# Patient Record
Sex: Male | Born: 1965 | Race: Black or African American | Hispanic: No | Marital: Single | Smoking: Never smoker
Health system: Southern US, Community
[De-identification: ages and names within clinical notes are randomized; demographics above are authoritative.]

## PROBLEM LIST (undated history)

## (undated) DIAGNOSIS — I10 Essential (primary) hypertension: Secondary | ICD-10-CM

## (undated) DIAGNOSIS — L309 Dermatitis, unspecified: Secondary | ICD-10-CM

## (undated) DIAGNOSIS — R51 Headache: Secondary | ICD-10-CM

## (undated) HISTORY — DX: Dermatitis, unspecified: L30.9

## (undated) HISTORY — DX: Headache: R51

## (undated) HISTORY — DX: Essential (primary) hypertension: I10

---

## 1999-01-13 ENCOUNTER — Emergency Department (HOSPITAL_COMMUNITY): Admission: EM | Admit: 1999-01-13 | Discharge: 1999-01-13 | Payer: Self-pay | Admitting: Emergency Medicine

## 2000-09-14 ENCOUNTER — Encounter: Payer: Self-pay | Admitting: Emergency Medicine

## 2000-09-14 ENCOUNTER — Emergency Department (HOSPITAL_COMMUNITY): Admission: EM | Admit: 2000-09-14 | Discharge: 2000-09-14 | Payer: Self-pay | Admitting: *Deleted

## 2005-04-01 ENCOUNTER — Ambulatory Visit: Payer: Self-pay | Admitting: Internal Medicine

## 2005-04-08 ENCOUNTER — Ambulatory Visit: Payer: Self-pay | Admitting: Internal Medicine

## 2005-06-30 ENCOUNTER — Ambulatory Visit: Payer: Self-pay | Admitting: Internal Medicine

## 2007-04-09 DIAGNOSIS — J309 Allergic rhinitis, unspecified: Secondary | ICD-10-CM | POA: Insufficient documentation

## 2007-12-07 ENCOUNTER — Ambulatory Visit: Payer: Self-pay | Admitting: Internal Medicine

## 2009-03-23 ENCOUNTER — Ambulatory Visit: Payer: Self-pay | Admitting: Internal Medicine

## 2009-03-23 DIAGNOSIS — R519 Headache, unspecified: Secondary | ICD-10-CM | POA: Insufficient documentation

## 2009-03-23 DIAGNOSIS — R51 Headache: Secondary | ICD-10-CM

## 2009-04-06 ENCOUNTER — Ambulatory Visit: Payer: Self-pay | Admitting: Internal Medicine

## 2009-04-06 DIAGNOSIS — I10 Essential (primary) hypertension: Secondary | ICD-10-CM | POA: Insufficient documentation

## 2009-04-24 ENCOUNTER — Ambulatory Visit: Payer: Self-pay | Admitting: Internal Medicine

## 2009-04-27 ENCOUNTER — Telehealth: Payer: Self-pay | Admitting: Internal Medicine

## 2009-06-05 ENCOUNTER — Ambulatory Visit: Payer: Self-pay | Admitting: Internal Medicine

## 2010-10-31 ENCOUNTER — Ambulatory Visit (INDEPENDENT_AMBULATORY_CARE_PROVIDER_SITE_OTHER): Payer: BC Managed Care – PPO | Admitting: Internal Medicine

## 2010-10-31 ENCOUNTER — Encounter: Payer: Self-pay | Admitting: Internal Medicine

## 2010-10-31 VITALS — BP 158/96 | HR 102 | Temp 97.9°F | Ht 72.0 in | Wt 174.0 lb

## 2010-10-31 DIAGNOSIS — I10 Essential (primary) hypertension: Secondary | ICD-10-CM

## 2010-10-31 MED ORDER — HYDROCHLOROTHIAZIDE 25 MG PO TABS: 25.0000 mg | ORAL_TABLET | Freq: Every day | ORAL | Status: DC

## 2010-10-31 NOTE — Patient Instructions (Signed)
Hypertension (High Blood Pressure) As your heart beats, it forces blood through your arteries. This force is your blood pressure. If the pressure is too high, it is called hypertension (HTN) or high blood pressure. HTN is dangerous because you may have it and not know it. High blood pressure may mean that your heart has to work harder to pump blood. Your arteries may be narrow or stiff. The extra work puts you at risk for heart disease, stroke, and other problems.   Blood pressure consists of two numbers, a higher number over a lower, 110/72, for example. It is stated as "110 over 72." The ideal is below 120 for the top number (systolic) and under 80 for the bottom (diastolic). Write down your blood pressure today. You should pay close attention to your blood pressure if you have certain conditions such as:  Heart failure.   Prior heart attack.     Diabetes    Chronic kidney disease.     Prior stroke.     Multiple risk factors for heart disease.     To see if you have HTN, your blood pressure should be measured while you are seated with your arm held at the level of the heart. It should be measured at least twice. A one-time elevated blood pressure reading (especially in the Emergency Department) does not mean that you need treatment. There may be conditions in which the blood pressure is different between your right and left arms. It is important to see your caregiver soon for a recheck. Most people have essential hypertension which means that there is not a specific cause. This type of high blood pressure may be lowered by changing lifestyle factors such as:  Stress.   Smoking.    Lack of exercise.     Excessive weight.   Drug/tobacco/alcohol use.     Eating less salt.     Most people do not have symptoms from high blood pressure until it has caused damage to the body. Effective treatment can often prevent, delay or reduce that damage. TREATMENT Treatment for high blood pressure, when  a cause has been identified, is directed at the cause. There are a large number of medications to treat HTN. These fall into several categories, and your caregiver will help you select the medicines that are best for you. Medications may have side effects. You should review side effects with your caregiver. If your blood pressure stays high after you have made lifestyle changes or started on medicines,    Your medication(s) may need to be changed.   Other problems may need to be addressed.   Be certain you understand your prescriptions, and know how and when to take your medicine.   Be sure to follow up with your caregiver within the time frame advised (usually within two weeks) to have your blood pressure rechecked and to review your medications.   If you are taking more than one medicine to lower your blood pressure, make sure you know how and at what times they should be taken. Taking two medicines at the same time can result in blood pressure that is too low.  SEEK IMMEDIATE MEDICAL CARE IF YOU DEVELOP:  A severe headache, blurred or changing vision, or confusion.   Unusual weakness or numbness, or a faint feeling.   Severe chest or abdominal pain, vomiting, or breathing problems.  MAKE SURE YOU:    Understand these instructions.   Will watch your condition.   Will get help right  away if you are not doing well or get worse.  Document Released: 07/21/2005 Document Re-Released: 01/08/2010 [facility] Patient Information 2011 [place], [place].  Your headaches may be related to high blood pressure. Your exam today is normal: no signs of any neurologic problem, no sign of brain tumor, etc.   Plan - take the hydrochlorothiazide every day           Ok to take BC's for the headache - watch for stomach pains           Come by in 1 week for BP check with my nurse           Call for problems.

## 2010-11-03 ENCOUNTER — Encounter: Payer: Self-pay | Admitting: Internal Medicine

## 2010-11-03 NOTE — Progress Notes (Signed)
  Subjective:    Patient ID: Donald Matthews, male    DOB: 28-Apr-1966, 45 y.o.   MRN: [id number]  HPI  Mr. Therien presents for follow-up of HTN. He had been on HCTZ as single agent but stopped the medication and worked on life style management with better diet and increased exercise. He reports that his BP at home has been well controlled with systolics in the 120-130 range.. He has had no complaints: no headache, no palpations, no erectile problems.  PMH, FamHx and SocHx reviewed for relevance and chage.     Review of Systems Review of Systems  Constitutional:  Negative for fever, chills, activity change and unexpected weight change.  HENT:  Negative for hearing loss, ear pain, congestion, neck stiffness and postnasal drip.   Eyes: Negative for pain, discharge and visual disturbance.  Respiratory: Negative for chest tightness and wheezing.   Cardiovascular: Negative for chest pain and palpitations.       [No decreased exercise tolerance Gastrointestinal: [No change in bowel habit. No bloating or gas. No reflux or indigestion Genitourinary: Negative for urgency, frequency, flank pain and difficulty urinating.  Musculoskeletal: Negative for myalgias, back pain, arthralgias and gait problem.  Neurological: Negative for dizziness, tremors, weakness and headaches.  Hematological: Negative for adenopathy.  Psychiatric/Behavioral: Negative for behavioral problems and dysphoric mood.       Objective:   Physical Exam  Nursing note and vitals reviewed. Constitutional: He is oriented to person, place, and time. He appears well-developed and well-nourished. No distress.  HENT:  Head: Normocephalic and atraumatic.  Eyes: Conjunctivae and EOM are normal. Pupils are equal, round, and reactive to light.  Neck: Neck supple.  Cardiovascular: Normal rate and regular rhythm.   Pulmonary/Chest: Effort normal and breath sounds normal.  Musculoskeletal: Normal range of motion.  Neurological: He is  alert and oriented to person, place, and time. He has normal reflexes.  Skin: Skin is warm and dry.  Psychiatric: He has a normal mood and affect. His behavior is normal.   Rechecked BP using a large adult cuff: 140/86        Assessment & Plan:  1. Hypertension - well controlled at home by patient's report on life-style management alone.  Plan - continue present life-style management            Bring home monitor by for calibration at his convenience.

## 2010-11-04 ENCOUNTER — Ambulatory Visit (INDEPENDENT_AMBULATORY_CARE_PROVIDER_SITE_OTHER): Payer: BC Managed Care – PPO | Admitting: *Deleted

## 2010-11-04 VITALS — BP 132/82

## 2010-11-04 DIAGNOSIS — I10 Essential (primary) hypertension: Secondary | ICD-10-CM

## 2010-12-13 ENCOUNTER — Encounter: Payer: Self-pay | Admitting: Internal Medicine

## 2010-12-16 ENCOUNTER — Encounter: Payer: Self-pay | Admitting: Internal Medicine

## 2010-12-16 ENCOUNTER — Ambulatory Visit (INDEPENDENT_AMBULATORY_CARE_PROVIDER_SITE_OTHER): Payer: BC Managed Care – PPO | Admitting: Internal Medicine

## 2010-12-16 DIAGNOSIS — N529 Male erectile dysfunction, unspecified: Secondary | ICD-10-CM | POA: Insufficient documentation

## 2010-12-16 DIAGNOSIS — I10 Essential (primary) hypertension: Secondary | ICD-10-CM

## 2010-12-16 MED ORDER — SILDENAFIL CITRATE 100 MG PO TABS: 100.0000 mg | ORAL_TABLET | ORAL | Status: DC | PRN

## 2010-12-16 NOTE — Progress Notes (Signed)
  Subjective:    Patient ID: Donald Matthews, male    DOB: Apr 04, 1966, 45 y.o.   MRN: [id number]  HPI Donald Matthews presents c/o 1 year history of ED. He is able to attain and sustain and erection to the completion of intercourse but not always and not as satisfactorily.   He reports his BP has been well controlled.   PMH, FamHx and SocHx reviewed for any changes and relevance.    Review of Systems Review of Systems  Constitutional:  Negative for fever, chills, activity change and unexpected weight change.  HENT:  Negative for hearing loss, ear pain, congestion, neck stiffness and postnasal drip.   Eyes: Negative for pain, discharge and visual disturbance.  Respiratory: Negative for chest tightness and wheezing.   Cardiovascular: Negative for chest pain and palpitations.       [No decreased exercise tolerance Gastrointestinal: [No change in bowel habit. No bloating or gas. No reflux or indigestion Genitourinary: Negative for urgency, frequency, flank pain and difficulty urinating.  Musculoskeletal: Negative for myalgias, back pain, arthralgias and gait problem.  Neurological: Negative for dizziness, tremors, weakness and headaches.  Hematological: Negative for adenopathy.  Psychiatric/Behavioral: Negative for behavioral problems and dysphoric mood.       Objective:   Physical Exam WNWD AA male in no distress HEENT C&S clear Cor - RRR       Assessment & Plan:  1. ED - patient with moderate ED symptoms. He has no h/o heart disease and doesn't take any nitroglycerine products. Discussed the mechanisim of action and duration.  Plan - viagra 50 to 100mg  prn. (samples provided)  2. Blood pressure - well controlled.

## 2011-07-14 ENCOUNTER — Encounter: Payer: Self-pay | Admitting: Internal Medicine

## 2011-07-14 ENCOUNTER — Ambulatory Visit (INDEPENDENT_AMBULATORY_CARE_PROVIDER_SITE_OTHER): Payer: BC Managed Care – PPO | Admitting: Internal Medicine

## 2011-07-14 VITALS — BP 148/80 | HR 95 | Temp 97.8°F | Wt 172.0 lb

## 2011-07-14 DIAGNOSIS — L309 Dermatitis, unspecified: Secondary | ICD-10-CM

## 2011-07-14 DIAGNOSIS — I10 Essential (primary) hypertension: Secondary | ICD-10-CM

## 2011-07-14 DIAGNOSIS — L259 Unspecified contact dermatitis, unspecified cause: Secondary | ICD-10-CM

## 2011-07-14 MED ORDER — PREDNISONE 20 MG PO TABS: 20.0000 mg | ORAL_TABLET | Freq: Every day | ORAL | Status: AC

## 2011-07-14 MED ORDER — HYDROCHLOROTHIAZIDE 25 MG PO TABS: 25.0000 mg | ORAL_TABLET | Freq: Every day | ORAL | Status: DC

## 2011-07-14 MED ORDER — SILDENAFIL CITRATE 100 MG PO TABS: 100.0000 mg | ORAL_TABLET | ORAL | Status: DC | PRN

## 2011-07-14 NOTE — Assessment & Plan Note (Signed)
Patient with flare of eczema  Plan - prednisone 20mg  daily for 10 days with 1 refill as needed.

## 2011-07-14 NOTE — Progress Notes (Signed)
  Subjective:    Patient ID: Donald Matthews, male    DOB: March 20, 1966, 45 y.o.   MRN: [id number]  HPI Mr. Brissett has a long h/o eczema for which he uses protopic, Ammonium lactate 12% cream; triamcinolone 0/.1% cream and protopic all prescribed by Dr. Gwen Pounds, Dermatologist in [place]. He has recently had a bad flare with skin flaking, pruritis with subsequent excoriation with bleeding. He has had no fever, chills. NO frank pustules  Past Medical History  Diagnosis Date  . HTN (hypertension)   . Headache   . Allergic rhinitis    No past surgical history on file. Family History  Problem Relation Age of Onset  . Hypertension Father   . Hyperlipidemia Father   . Hypertension Mother   . Heart attack Neg Hx   . Coronary artery disease Neg Hx   . Colon cancer Neg Hx   . Prostate cancer Neg Hx   . Diabetes Neg Hx   . Cancer Neg Hx     prostate, colon    History   Social History  . Marital Status: Single    Spouse Name: N/A    Number of Children: 0  . Years of Education: N/A   Occupational History  . correction officer    Social History Main Topics  . Smoking status: Never Smoker   . Smokeless tobacco: Not on file  . Alcohol Use: Yes  . Drug Use: Not on file  . Sexually Active: Not on file   Other Topics Concern  . Not on file   Social History Narrative   HSG, 3 years collegeMarried - '01 - 5 years/divorcedNo children       Review of Systems System review is negative for any constitutional, cardiac, pulmonary, GI or neuro symptoms or complaints other than as described in the HPI.     Objective:   Physical Exam vitals - stable Gen'l - WNWD AA man no distress Pulm - normal respirations Cor- RRR Derm - chronic skin changes on the back with signs of excoriation.        Assessment & Plan:

## 2012-02-24 ENCOUNTER — Ambulatory Visit (INDEPENDENT_AMBULATORY_CARE_PROVIDER_SITE_OTHER): Payer: BC Managed Care – PPO | Admitting: Internal Medicine

## 2012-02-24 ENCOUNTER — Encounter: Payer: Self-pay | Admitting: Internal Medicine

## 2012-02-24 VITALS — BP 134/92 | HR 97 | Temp 98.0°F | Resp 16 | Wt 172.0 lb

## 2012-02-24 DIAGNOSIS — L639 Alopecia areata, unspecified: Secondary | ICD-10-CM | POA: Insufficient documentation

## 2012-02-24 MED ORDER — PREDNISONE 20 MG PO TABS: 20.0000 mg | ORAL_TABLET | Freq: Every day | ORAL | Status: AC

## 2012-02-24 NOTE — Progress Notes (Signed)
  Subjective:    Patient ID: Donald Matthews, male    DOB: 12-18-1965, 46 y.o.   MRN: [id number]  HPI Donald Matthews presents for hair loss over the last month. No new hair products, shampoos, etc. His hair has loss was all of a sudden. He has minimal pruritus. He does have chronic eczema with a truncal, extremity distribution.  Past Medical History  Diagnosis Date  . HTN (hypertension)   . Headache   . Allergic rhinitis   . Chronic eczema    No past surgical history on file. Family History  Problem Relation Age of Onset  . Hypertension Father   . Hyperlipidemia Father   . Hypertension Mother   . Heart attack Neg Hx   . Coronary artery disease Neg Hx   . Colon cancer Neg Hx   . Prostate cancer Neg Hx   . Diabetes Neg Hx   . Cancer Neg Hx     prostate, colon    History   Social History  . Marital Status: Single    Spouse Name: N/A    Number of Children: 0  . Years of Education: N/A   Occupational History  . correction officer    Social History Main Topics  . Smoking status: Never Smoker   . Smokeless tobacco: Not on file  . Alcohol Use: Yes  . Drug Use: Not on file  . Sexually Active: Not on file   Other Topics Concern  . Not on file   Social History Narrative   HSG, 3 years collegeMarried - '01 - 5 years/divorcedNo children    Current Outpatient Prescriptions on File Prior to Visit  Medication Sig Dispense Refill  . ammonium lactate (AMLACTIN) 12 % cream Apply topically as needed.        [name] aspirin-acetaminophen-caffeine (EXCEDRIN MIGRAINE) 250-250-65 MG per tablet Take 1 tablet by mouth as needed.        . cephALEXin (KEFLEX) 500 MG capsule Take 500 mg by mouth as needed.        . hydrochlorothiazide (HYDRODIURIL) 25 MG tablet Take 1 tablet (25 mg total) by mouth daily.  30 tablet  121  . hydrocodone-acetaminophen (LORCET-HD) 5-500 MG per capsule Take 1 capsule by mouth every 6 (six) hours as needed.        . sildenafil (VIAGRA) 100 MG tablet Take 1 tablet  (100 mg total) by mouth as needed for erectile dysfunction.  6 tablet  11  . tacrolimus (PROTOPIC) 0.1 % ointment Apply topically 2 (two) times daily as needed.        . triamcinolone cream (KENALOG) 0.1 % Apply topically 2 (two) times daily.            Review of Systems .mnroxb     Objective:   Physical Exam Filed Vitals:   02/24/12 1138  BP: 134/92  Pulse: 97  Temp: 98 F (36.7 C)  Resp: 16   Gen'l- WNWD athletic appearing AA man Derm - thinning hair but no complete bald spots. Mild grey scaling on scalp        Assessment & Plan:

## 2012-02-24 NOTE — Assessment & Plan Note (Signed)
New outbreak of hair loss without total baldness. 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  Plan  Patient education  Mild dandruff shampoo  See Dr. Gwen Pounds - for derm consult.

## 2012-02-24 NOTE — Patient Instructions (Addendum)
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 There are no areas of total hair loss. For now recommend using a mild dandruff shampoo of choice. Would not use topical steroids at this point. Go ahead and make an appointment with your dermatologist to be sure of the diagnosis and treatment plan. This very often will resolve spontaneously without treatment.   Alopecia Areata Alopecia areata is a self-destructing (autoimmune) disease that results in the loss of hair. In this condition your body's immune system attacks the hair follicle. The hair follicle is responsible for growing hair. Hair loss can occur on the scalp and other parts of the body. It usually starts as one or more small, round, smooth patches of hair loss. It occurs in males and females of all ages and races, but usually starts before age 59. The scalp is the most commonly affected area, but the beard or any hair-bearing site can be affected. This type of hair loss does not leave scars where the hair was lost.   Many people with alopecia areata only have a few patches of hair loss. In others, extensive patchy hair loss occurs. In a few people, all scalp hair is lost (alopecia totalis), or hair is lost from the entire scalp and body (alopecia universalis). No matter how widespread the hair loss, the hair follicles remain alive and are ready to resume normal hair production whenever they receive the correct signal. Hair re-growth may occur without treatment and can even restart after years of hair loss.   CAUSES   It is thought that something triggers the immune system to stop hair growth. It is not always known what the cause is. Some people have genetic markers that can increase the chance of developing alopecia areata. Alopecia areata often occurs in families whose members have had:  Asthma.   Hay fever.   Atopic eczema.   Some autoimmune diseases  may also be a trigger, such as:   Thyroid disease.   Diabetes.   Rheumatoid arthritis.   Lupus erythematosus.   Vitiligo.   Pernicious anemia.   Addison's disease.  OTHER SYMPTOMS In some people, the nail beds may develop rows of tiny dents (stippling) or the nail beds can become distorted. Other than the hair and nail beds, no other body part is affected.   PROGNOSIS   Alopecia areata is not medically disabling. People with alopecia areata are usually in excellent health. Hair loss can be emotionally difficult. The National Alopecia Areata Foundation has resources available to help individuals and families with alopecia areata. Their goal is to help people with the condition live full, productive lives. There are many successful, well-adjusted, contented people living with Alopecia areata. Alopecia areata can be overcome with:  A positive self image.   Sound medical facts.   The support of others, such as:   Sometimes professional counseling is helpful to develop one's self-confidence and positive self-image.  TREATMENT   There is no cure for alopecia areata. There are several available treatments. Treatments are most effective in milder cases. No treatment is effective for everyone. Choice of treatment depends mainly on a person's age and the extent of their hair loss. Alopecia areata occurs in two forms:    A mild patchy form where less than 50 percent of scalp hair is lost.   An extensive form where greater than 50 percent of scalp hair is lost.  These two  forms of alopecia areata behave quite differently, and the choice of treatment depends on which form is present. Current treatments do not turn alopecia areata off. They can stimulate the hair follicle to produce hair.   Some medications used to treat mild cases include:  Cortisone injections. The most common treatment is the injection of cortisone into the bare skin patches. The injections are usually given by a caregiver  specializing in skin issues (dermatologist). This caregiver will use a tiny needle to give multiple injections into the skin in and around the bare patches. The injections are repeated once a month. If new hair growth occurs, it is usually visible within 4 weeks. Treatment does not prevent new patches of hair loss from developing. There are few side effects from local cortisone injections. Occasionally, temporary dents (depressions) in the skin result from the local injections, but these dents can fill in by themselves.   Topical minoxidil. Five percent topical minoxidil solution applied twice daily may grow hair in alopecia areata. Scalp, eyebrows, and beard hair may respond. If scalp hair re-grows completely, treatment can be stopped. Response may improve if topical cortisone cream is applied 30 minutes after the minoxidil. Topical minoxidil is safe, easy to use, and does not lower blood pressure in persons with normal blood pressure. Minoxidil can lead to unwanted facial hair growth in some people.   Anthralin cream or ointment. Another treatment is the application of anthralin cream or ointment. Anthralin is a tar-like substance that has been used widely for psoriasis. Anthralin is applied to the bare patches once daily. It is washed off after a short time, usually 30 to 60 minutes later. If new hair growth occurs, it is seen in 8 to 12 weeks. Anthralin can be irritating to the skin. It can cause temporary, brownish discoloration of the treated skin. By using short treatment times, skin irritation and skin staining are reduced without decreasing effectiveness. Care must be taken not to get anthralin in the eyes.  Some of the medications used for more extensive cases where there is greater than 50% hair loss include:  Cortisone pills. Cortisone pills are sometimes given for extensive scalp hair loss. Cortisone taken internally is much stronger than local injections of cortisone into the skin. It is  necessary to discuss possible side effects of cortisone pills with your caregiver. In general, however, cortisone pills are used in relatively few patients with alopecia areata due to health risks from prolonged use. Also, hair that has grown is likely to fall out when the cortisone pills are stopped.   Topical minoxidil. See previous explanation under mild, patchy alopecia areata. However, minoxidil is not effective in total loss of scalp hair (alopecia totalis).   Topical immunotherapy. Another method of treating alopecia areata or alopecia totalis/universalis involves producing an allergic rash or allergic contact dermatitis. Chemicals such as diphencyprone (DPCP) or squaric acid dibutyl ester (SADBE) are applied to the scalp to produce an allergic rash which resembles poison oak or ivy. Approximately 40% of patients treated with topical immunotherapy will re-grow scalp hair after about 6 months of treatment. Those who do successfully re-grow scalp hair will need to continue treatment to maintain hair re-growth.   Wigs. For extensive hair loss, a wig can be an important option for some people. Proper attention will make a quality wig look completely natural. A wig will need to be cut, thinned, and styled. To keep a net base wig from falling off, special double-sided tape can be purchased in beauty  supply outlets and fastened to the inside of the wig.   For those with completely bare heads, there are suction caps to which any wig can be attached. There are also entire suction cap wig units.  FOR MORE INFORMATION National Alopecia Areata Foundation: [email or web address] Document Released: 02/23/2004 Document Revised: 07/10/2011 Document Reviewed: 09/26/2008 [facility] Patient Information 2012 [place], [place].

## 2012-03-03 ENCOUNTER — Other Ambulatory Visit: Payer: Self-pay | Admitting: Internal Medicine

## 2012-06-07 ENCOUNTER — Other Ambulatory Visit: Payer: Self-pay | Admitting: Internal Medicine

## 2012-08-18 ENCOUNTER — Ambulatory Visit (INDEPENDENT_AMBULATORY_CARE_PROVIDER_SITE_OTHER): Payer: BC Managed Care – PPO | Admitting: Internal Medicine

## 2012-08-18 ENCOUNTER — Telehealth: Payer: Self-pay | Admitting: Internal Medicine

## 2012-08-18 ENCOUNTER — Encounter: Payer: Self-pay | Admitting: Internal Medicine

## 2012-08-18 VITALS — BP 122/82 | HR 89 | Temp 97.8°F | Resp 12 | Ht 72.0 in | Wt 168.1 lb

## 2012-08-18 DIAGNOSIS — L259 Unspecified contact dermatitis, unspecified cause: Secondary | ICD-10-CM

## 2012-08-18 DIAGNOSIS — L309 Dermatitis, unspecified: Secondary | ICD-10-CM

## 2012-08-18 MED ORDER — PREDNISONE 10 MG PO TABS: 10.0000 mg | ORAL_TABLET | Freq: Every day | ORAL | Status: DC

## 2012-08-18 MED ORDER — AMMONIUM LACTATE 12 % EX CREA: TOPICAL_CREAM | CUTANEOUS | Status: DC | PRN

## 2012-08-18 NOTE — Assessment & Plan Note (Signed)
1. Rash - recurrent outbreak of eczema on the back. NO sign of a bacterial infection. Plan  Refill on ammoniuym lactate cream - use twice a day  Triamcinolone (Kenalog) use twice a day  Prednisone burst and taper as directed  Might want to touch base with Dr. Gwen Pounds in [place].

## 2012-08-18 NOTE — Telephone Encounter (Signed)
Patient seen 08/18/12 by Dr. Debby Bud

## 2012-08-18 NOTE — Patient Instructions (Addendum)
1. Rash - recurrent outbreak of eczema on the back. NO sign of a bacterial infection. Plan  Refill on ammoniuym lactate cream - use twice a day  Triamcinolone (Kenalog) use twice a day  Prednisone burst and taper as directed  Might want to touch base with Dr. Gwen Pounds in [place].  2. Body weight: BMI is 23 - perfect. Normal is 19-25.

## 2012-08-18 NOTE — Progress Notes (Signed)
Subjective:    Patient ID: Donald Matthews, male    DOB: Apr 13, 1966, 47 y.o.   MRN: [id number]  HPI Donald Matthews presents for evaluation and treatment of recurrent rash on his back. He has had a cough but this is resolved. Rash is very pruritic. He has been seen by dermatologist in the past and was treated with triamcinolone cram and keflex. This is recurrent.  He is concerned about his weight. Reviewed records and his BMI is 23 and has been this level for over a year. Normal range.  BP is well controlled off HCTZ.  Past Medical History  Diagnosis Date  . HTN (hypertension)   . Headache   . Allergic rhinitis   . Chronic eczema    History reviewed. No pertinent past surgical history. Family History  Problem Relation Age of Onset  . Hypertension Father   . Hyperlipidemia Father   . Hypertension Mother   . Heart attack Neg Hx   . Coronary artery disease Neg Hx   . Colon cancer Neg Hx   . Prostate cancer Neg Hx   . Diabetes Neg Hx   . Cancer Neg Hx     prostate, colon    History   Social History  . Marital Status: Single    Spouse Name: N/A    Number of Children: 0  . Years of Education: N/A   Occupational History  . correction officer    Social History Main Topics  . Smoking status: Never Smoker   . Smokeless tobacco: Not on file  . Alcohol Use: Yes  . Drug Use: Not on file  . Sexually Active: Not on file   Other Topics Concern  . Not on file   Social History Narrative   HSG, 3 years collegeMarried - '01 - 5 years/divorcedNo children    Current Outpatient Prescriptions on File Prior to Visit  Medication Sig Dispense Refill  . ammonium lactate (AMLACTIN) 12 % cream Apply topically as needed.        [name] aspirin-acetaminophen-caffeine (EXCEDRIN MIGRAINE) 250-250-65 MG per tablet Take 1 tablet by mouth as needed.        . cephALEXin (KEFLEX) 500 MG capsule Take 500 mg by mouth as needed.        . hydrochlorothiazide (HYDRODIURIL) 25 MG tablet Take 1 tablet (25  mg total) by mouth daily.  30 tablet  121  . hydrocodone-acetaminophen (LORCET-HD) 5-500 MG per capsule Take 1 capsule by mouth every 6 (six) hours as needed.        . tacrolimus (PROTOPIC) 0.1 % ointment Apply topically 2 (two) times daily as needed.        . triamcinolone cream (KENALOG) 0.1 % Apply topically 2 (two) times daily.        [name] VIAGRA 100 MG tablet TAKE 1 TABLET BY MOUTH AS NEEDED FOR ERECTILE DYSFUNCTION.  9 each  0      Review of Systems System review is negative for any constitutional, cardiac, pulmonary, GI or neuro symptoms or complaints other than as described in the HPI.     Objective:   Physical Exam Filed Vitals:   08/18/12 1130  BP: 122/82  Pulse: 89  Temp: 97.8 F (36.6 C)  Resp: 12   Wt Readings from Last 3 Encounters:  08/18/12 168 lb 1.3 oz (76.241 kg)  02/24/12 172 lb (78.019 kg)  07/14/11 172 lb (78.019 kg)   Body mass index is 22.80 kg/(m^2).  Gen'l WNWD AA man in no distress  Cor - RRR Pulm - normal respirations Derm - back with sandpaper roughness, mild erythema, areas of minor excoriation.         Assessment & Plan:

## 2012-08-18 NOTE — Telephone Encounter (Signed)
[facility] Triage Call Report Triage Record Num: [id number] Operator: Albertine Grates Patient Name: [facility] Call Date & Time: 08/17/2012 5:30:05PM Patient Phone: [phone] PCP: Illene Regulus Patient Gender: Male PCP Fax : [phone] Patient DOB: 01-29-1966 [facility] Name[facility] Reason for Call: Caller: Gloria/Mother; PCP: Illene Regulus (Adults only); CB#: [phone]; Call regarding Cough/Congestion; Has cough/congestion since 1-9. Afebrile. Is coughing yellow mucus. Per Cough protocol, advised appointment 1-15 and scheduled for 1115 due to yellow mucus. Protocol(s) Used: Cough - Adult Recommended Outcome per Protocol: See Provider within 24 hours Reason for Outcome: Productive cough with colored sputum (other than clear or white sputum) Care Advice: Increase fluids to 8-12 eight oz (1.6 to 2.4 liters) glasses per day, half of them to be water. Soups, popsicles, fruit juices, non-caffeinated sodas (unless restricting sodium intake), jello, broths, decaf teas, etc. are all okay. Warm fluids can be soothing

## 2012-09-06 ENCOUNTER — Ambulatory Visit: Payer: BC Managed Care – PPO | Admitting: Internal Medicine

## 2012-11-08 ENCOUNTER — Other Ambulatory Visit: Payer: Self-pay | Admitting: Internal Medicine

## 2012-12-27 DIAGNOSIS — I10 Essential (primary) hypertension: Secondary | ICD-10-CM | POA: Insufficient documentation

## 2012-12-27 DIAGNOSIS — IMO0002 Reserved for concepts with insufficient information to code with codable children: Secondary | ICD-10-CM | POA: Insufficient documentation

## 2012-12-27 DIAGNOSIS — Z8709 Personal history of other diseases of the respiratory system: Secondary | ICD-10-CM | POA: Insufficient documentation

## 2012-12-27 DIAGNOSIS — Z872 Personal history of diseases of the skin and subcutaneous tissue: Secondary | ICD-10-CM | POA: Insufficient documentation

## 2012-12-28 ENCOUNTER — Emergency Department (HOSPITAL_COMMUNITY): Payer: BC Managed Care – PPO

## 2012-12-28 ENCOUNTER — Encounter (HOSPITAL_COMMUNITY): Payer: Self-pay | Admitting: Adult Health

## 2012-12-28 ENCOUNTER — Emergency Department (HOSPITAL_COMMUNITY)
Admission: EM | Admit: 2012-12-28 | Discharge: 2012-12-28 | Disposition: A | Discharge status: Home / Self Care | Payer: BC Managed Care – PPO | Attending: Emergency Medicine | Admitting: Emergency Medicine

## 2012-12-28 DIAGNOSIS — S0001XA Abrasion of scalp, initial encounter: Secondary | ICD-10-CM

## 2012-12-28 NOTE — ED Provider Notes (Signed)
Medical screening examination/treatment/procedure(s) were performed by non-physician practitioner and as supervising physician I was immediately available for consultation/collaboration. Devoria Albe, MD, Armando Gang   Ward Givens, MD 12/28/12 [phone]
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
## 2012-12-28 NOTE — ED Notes (Signed)
The pt just went to  xray 

## 2013-01-19 ENCOUNTER — Other Ambulatory Visit: Payer: Self-pay

## 2013-01-19 MED ORDER — PREDNISONE 10 MG PO TABS: ORAL_TABLET | ORAL | Status: DC

## 2013-02-17 ENCOUNTER — Other Ambulatory Visit: Payer: Self-pay | Admitting: Internal Medicine

## 2013-03-04 ENCOUNTER — Other Ambulatory Visit: Payer: Self-pay | Admitting: Internal Medicine

## 2013-04-08 ENCOUNTER — Other Ambulatory Visit: Payer: Self-pay | Admitting: Internal Medicine

## 2013-05-23 ENCOUNTER — Encounter: Payer: Self-pay | Admitting: Internal Medicine

## 2013-05-23 ENCOUNTER — Ambulatory Visit (INDEPENDENT_AMBULATORY_CARE_PROVIDER_SITE_OTHER): Payer: BC Managed Care – PPO | Admitting: Internal Medicine

## 2013-05-23 VITALS — BP 142/90 | HR 94 | Temp 98.0°F | Ht 72.0 in | Wt 170.5 lb

## 2013-05-23 DIAGNOSIS — M25522 Pain in left elbow: Secondary | ICD-10-CM

## 2013-05-23 DIAGNOSIS — M25529 Pain in unspecified elbow: Secondary | ICD-10-CM

## 2013-05-23 DIAGNOSIS — M25429 Effusion, unspecified elbow: Secondary | ICD-10-CM

## 2013-05-23 DIAGNOSIS — M25422 Effusion, left elbow: Secondary | ICD-10-CM

## 2013-05-23 DIAGNOSIS — L309 Dermatitis, unspecified: Secondary | ICD-10-CM

## 2013-05-23 DIAGNOSIS — L259 Unspecified contact dermatitis, unspecified cause: Secondary | ICD-10-CM

## 2013-05-23 MED ORDER — PREDNISONE 10 MG PO TABS: ORAL_TABLET | ORAL | Status: DC

## 2013-05-23 NOTE — Progress Notes (Signed)
Subjective:    Patient ID: Donald Matthews, male    DOB: January 29, 1966, 47 y.o.   MRN: [id number]  HPI  Pt presents to the clinic today with c/o rash on his back, neck and pain and swelling of his left elbow. This started 2 days ago. He is not sure what caused it. He thinks he may have got bite by a spider. He has not had an injury to the elbow that he is aware of. He has taken Ibuprofen which did not really help. He denies numbness and tingling lin the left hand. He denies fever.  Review of Systems      Past Medical History  Diagnosis Date  . HTN (hypertension)   . Headache(784.0)   . Allergic rhinitis   . Chronic eczema     Current Outpatient Prescriptions  Medication Sig Dispense Refill  . predniSONE (DELTASONE) 10 MG tablet 3 TABS/DAY FOR 3 DAYS, 2 TABS/DAY 3 DAYS,THEN 1 TAB/DAY 6 DAYS FOR ECZEMA OUTBREAK.  21 tablet  1  . VIAGRA 100 MG tablet TAKE 1 TABLET BY MOUTH AS NEEDED FOR ERECTILE DYSFUNCTION.  6 tablet  5   No current facility-administered medications for this visit.    Allergies  Allergen Reactions  . Penicillins     REACTION: swelling    Family History  Problem Relation Age of Onset  . Hypertension Father   . Hyperlipidemia Father   . Hypertension Mother   . Heart attack Neg Hx   . Coronary artery disease Neg Hx   . Colon cancer Neg Hx   . Prostate cancer Neg Hx   . Diabetes Neg Hx   . Cancer Neg Hx     prostate, colon     History   Social History  . Marital Status: Single    Spouse Name: N/A    Number of Children: 0  . Years of Education: N/A   Occupational History  . correction officer    Social History Main Topics  . Smoking status: Never Smoker   . Smokeless tobacco: Not on file  . Alcohol Use: Yes  . Drug Use: Not on file  . Sexual Activity: Not on file   Other Topics Concern  . Not on file   Social History Narrative   HSG, 3 years college   Married - '01 - 5 years/divorced   No children           Constitutional: Denies  fever, malaise, fatigue, headache or abrupt weight changes.  Musculoskeletal: Pt reports pain and swelling of left elbow. Denies decrease in range of motion, difficulty with gait, muscle pain.  Skin: Pt reports rash on neck and back. Denies lesions or ulcercations.    No other specific complaints in a complete review of systems (except as listed in HPI above).  Objective:   Physical Exam   BP 142/90  Pulse 94  Temp(Src) 98 F (36.7 C) (Oral)  Ht 6' (1.829 m)  Wt 170 lb 8 oz (77.338 kg)  BMI 23.12 kg/m2  SpO2 96% Wt Readings from Last 3 Encounters:  05/23/13 170 lb 8 oz (77.338 kg)  08/18/12 168 lb 1.3 oz (76.241 kg)  02/24/12 172 lb (78.019 kg)    General: Appears his stated age, well developed, well nourished in NAD. Skin: Warm, dry and intact. No lesions or ulcerations noted. Eczema noted on neck and upper back. Cardiovascular: Normal rate and rhythm. S1,S2 noted.  No murmur, rubs or gallops noted. No JVD or BLE edema. No  carotid bruits noted. Pulmonary/Chest: Normal effort and positive vesicular breath sounds. No respiratory distress. No wheezes, rales or ronchi noted.  Musculoskeletal: Normal range of motion. 1+ swelling of left elbow. No difficulty with gait.        Assessment & Plan:   Eczema flare:  eRx for pred taper Avoid hot showers Use a moisturizing lotion such as Aveeno of [place]  Left elbow pain and swelling of unknown cause:  Has been getting better Ice it BID The prednisone will help as well  RTC as needed or if symptoms persist or worsen

## 2013-05-23 NOTE — Patient Instructions (Signed)
Spider Bite Spider bites are not common. Most spider bites do not cause serious problems. The elderly, very young children, and people with certain existing medical conditions are more likely to experience significant symptoms. SYMPTOMS  Spider bites may not cause any pain at first. Within 1 or 2 days of the bite, there may be swelling, redness, and pain in the bite area. However, some spider bites can cause pain within the first hour. TREATMENT  Your caregiver may prescribe antibiotic medicine if a bacterial infection develops in the bite. However, not all spider bites require antibiotics or prescription medicines.  HOME CARE INSTRUCTIONS  Do not scratch the bite area.  Keep the bite area clean and dry. Wash the area with soap and water as directed.  Put ice or cool compresses on the bite area.  Put ice in a plastic bag.  Place a towel between your skin and the bag.  Leave the ice on for 20 minutes, 4 times a day for the first 2 to 3 days, or as directed.  Keep the bite area elevated above the level of your heart. This helps reduce redness and swelling.  Only take over-the-counter or prescription medicines as directed by your caregiver.  If you are given antibiotics, take them as directed. Finish them even if you start to feel better. You may need a tetanus shot if:  You cannot remember when you had your last tetanus shot.  You have never had a tetanus shot.  The injury broke your skin. If you get a tetanus shot, your arm may swell, get red, and feel warm to the touch. This is common and not a problem. If you need a tetanus shot and you choose not to have one, there is a rare chance of getting tetanus. Sickness from tetanus can be serious. SEEK MEDICAL CARE IF: Your bite is not better after 3 days of treatment. SEEK IMMEDIATE MEDICAL CARE IF:  Your bite turns purple or develops increased swelling, pain, or redness.  You develop shortness of breath or chest pain.  You have  muscle cramps or painful muscle spasms.  You develop abdominal pain, nausea, or vomiting.  You feel unusually tired or sleepy. MAKE SURE YOU:  Understand these instructions.  Will watch your condition.  Will get help right away if you are not doing well or get worse. Document Released: 08/28/2004 Document Revised: 10/13/2011 Document Reviewed: 02/19/2011 Exit[employer] Patient Information 2014 [employer].  

## 2013-06-27 ENCOUNTER — Other Ambulatory Visit: Payer: BC Managed Care – PPO

## 2013-06-27 ENCOUNTER — Encounter: Payer: Self-pay | Admitting: Internal Medicine

## 2013-06-27 ENCOUNTER — Ambulatory Visit (INDEPENDENT_AMBULATORY_CARE_PROVIDER_SITE_OTHER): Payer: BC Managed Care – PPO | Admitting: Internal Medicine

## 2013-06-27 VITALS — BP 132/94 | HR 95 | Temp 97.2°F | Wt 174.8 lb

## 2013-06-27 DIAGNOSIS — M25522 Pain in left elbow: Secondary | ICD-10-CM

## 2013-06-27 DIAGNOSIS — M7022 Olecranon bursitis, left elbow: Secondary | ICD-10-CM

## 2013-06-27 DIAGNOSIS — M25422 Effusion, left elbow: Secondary | ICD-10-CM

## 2013-06-27 DIAGNOSIS — M25529 Pain in unspecified elbow: Secondary | ICD-10-CM

## 2013-06-27 DIAGNOSIS — M25429 Effusion, unspecified elbow: Secondary | ICD-10-CM

## 2013-06-27 DIAGNOSIS — L309 Dermatitis, unspecified: Secondary | ICD-10-CM

## 2013-06-27 DIAGNOSIS — M7020 Olecranon bursitis, unspecified elbow: Secondary | ICD-10-CM

## 2013-06-27 DIAGNOSIS — L259 Unspecified contact dermatitis, unspecified cause: Secondary | ICD-10-CM

## 2013-06-27 MED ORDER — PREDNISONE 10 MG PO TABS: ORAL_TABLET | ORAL | Status: DC

## 2013-06-27 NOTE — Progress Notes (Signed)
Pre visit review using our clinic review tool, if applicable. No additional management support is needed unless otherwise documented below in the visit note. 

## 2013-06-27 NOTE — Progress Notes (Signed)
  Subjective:    Patient ID: Donald Matthews, male    DOB: [birth year], 47 y.o.   MRN: [id number]  HPI Mr. Broadnax was seen by Ms. Baity Oct 20th for flare of eczema treated with steroid burst and taper and a sore, swollen elbow. He returns for continued elbow swelling and pain.   Also a recurrent flare of eczema  PMH, FamHx and SocHx reviewed for any changes and relevance. Current Outpatient Prescriptions on File Prior to Visit  Medication Sig Dispense Refill  . predniSONE (DELTASONE) 10 MG tablet 3 TABS/DAY FOR 3 DAYS, 2 TABS/DAY 3 DAYS,THEN 1 TAB/DAY 6 DAYS  21 tablet  1  . VIAGRA 100 MG tablet TAKE 1 TABLET BY MOUTH AS NEEDED FOR ERECTILE DYSFUNCTION.  6 tablet  5   No current facility-administered medications on file prior to visit.      Review of Systems System review is negative for any constitutional, cardiac, pulmonary, GI or neuro symptoms or complaints other than as described in the HPI.     Objective:   Physical Exam Filed Vitals:   06/27/13 1019  BP: 132/94  Pulse: 95  Temp: 97.2 F (36.2 C)   gen'l WNWD man in no distress Cor- RRR Pulm - CTAP MSK - left elbow with fluid collection - not hot, not tender Derm - eczema shoulder and upper back.   Procedure - bursa aspiration Indication - persistent fluid with discomfort Consent - after reviewing risk of bleeding and infection patient gave verbal consent Prep - betadiene follow by alcohol Aspiration - 50 cc syringe with #18g needle  - aspirated 15 cc free flowing blood tinged fluid with low viscosity Specimen sent to lab for cell count.  Patient tolerated well. Bandaide applied. Routine wound precautions        Assessment & Plan:  Bursitis - traumatic vs other left elbow. Aspirated w/o difficulty

## 2013-06-27 NOTE — Assessment & Plan Note (Signed)
Recurrent out break  Plan Repeat steroid burst and taper.

## 2013-06-27 NOTE — Patient Instructions (Signed)
Aspiration of left elbow bursa - watch for infection. Return for reaccumulation of fluid  Eczema - repeat course of steroids

## 2013-06-28 LAB — SYNOVIAL CELL COUNT + DIFF, W/ CRYSTALS
Eosinophils-Synovial: 0 % (ref 0–1)
Neutrophil, Synovial: 96 % — ABNORMAL HIGH (ref 0–25)
WBC, Synovial: 19505 cu mm — ABNORMAL HIGH (ref 0–200)

## 2013-07-01 ENCOUNTER — Other Ambulatory Visit: Payer: Self-pay | Admitting: Internal Medicine

## 2013-07-01 ENCOUNTER — Encounter: Payer: Self-pay | Admitting: Internal Medicine

## 2013-07-01 MED ORDER — SULFAMETHOXAZOLE-TMP DS 800-160 MG PO TABS: 1.0000 | ORAL_TABLET | Freq: Two times a day (BID) | ORAL | Status: DC

## 2013-07-06 ENCOUNTER — Ambulatory Visit (INDEPENDENT_AMBULATORY_CARE_PROVIDER_SITE_OTHER): Payer: BC Managed Care – PPO | Admitting: Internal Medicine

## 2013-07-06 ENCOUNTER — Encounter: Payer: Self-pay | Admitting: Internal Medicine

## 2013-07-06 VITALS — BP 138/80 | HR 92 | Temp 98.3°F | Wt 174.8 lb

## 2013-07-06 DIAGNOSIS — L259 Unspecified contact dermatitis, unspecified cause: Secondary | ICD-10-CM

## 2013-07-06 DIAGNOSIS — M7022 Olecranon bursitis, left elbow: Secondary | ICD-10-CM

## 2013-07-06 DIAGNOSIS — L309 Dermatitis, unspecified: Secondary | ICD-10-CM

## 2013-07-06 DIAGNOSIS — M7020 Olecranon bursitis, unspecified elbow: Secondary | ICD-10-CM

## 2013-07-06 NOTE — Patient Instructions (Signed)
1. Bursitis - your elbow looks much better with minimal residual fluid. The swelling and redness at the right hand may be related. Plan Warm compresses to the right hand  Finish all the Septra DS  2. Dermatology - for a flare of your eczema go ahead and pick up the prednisone prescription.   3. Blood pressure today looks good.

## 2013-07-06 NOTE — Progress Notes (Signed)
   Subjective:    Patient ID: Lafe Garin, male    DOB: 14-May-1966, 47 y.o.   MRN: [id number]  HPI Mr. Perea returns for follow up of bursitis left elbow. Fluid analysis was positive for WBCs. He was started on TMP/SMX DS for infectious bursitis. He has had swelling of the MCP 3rd digit right hand - by his report the swelling and redness is much improved.   He reports his eczema is flaring again.   PMH, FamHx and SocHx reviewed for any changes and relevance.  Current Outpatient Prescriptions on File Prior to Visit  Medication Sig Dispense Refill  . predniSONE (DELTASONE) 10 MG tablet 3 TABS/DAY FOR 3 DAYS, 2 TABS/DAY 3 DAYS,THEN 1 TAB/DAY 6 DAYS  21 tablet  1  . sulfamethoxazole-trimethoprim (BACTRIM DS) 800-160 MG per tablet Take 1 tablet by mouth 2 (two) times daily.  20 tablet  0  . VIAGRA 100 MG tablet TAKE 1 TABLET BY MOUTH AS NEEDED FOR ERECTILE DYSFUNCTION.  6 tablet  5   No current facility-administered medications on file prior to visit.      Review of Systems System review is negative for any constitutional, cardiac, pulmonary, GI or neuro symptoms or complaints other than as described in the HPI.     Objective:   Physical Exam Filed Vitals:   07/06/13 1431  BP: 138/80  Pulse: 92  Temp: 98.3 F (36.8 C)   gen'l  - WNWD man in no distress Cor - RRR Pulm - normal respirations MSK - left elbow with effusion, no heat, no tenderness       Assessment & Plan:  Olecranon bursitis with infected fluid - high WBC count - patient is still completing antibiotic course but the elbow looks much better. He has had no fever, chills and no drainage. He did have redness and swelling at the 3rd MCP joint right hand but this has also improved.  Plan Complete antbiotics.

## 2013-07-06 NOTE — Progress Notes (Signed)
Pre visit review using our clinic review tool, if applicable. No additional management support is needed unless otherwise documented below in the visit note. 

## 2013-07-09 NOTE — Assessment & Plan Note (Signed)
Recurrent flare -  Plan Another round of prednisone burst and taper.

## 2013-07-17 ENCOUNTER — Other Ambulatory Visit: Payer: Self-pay | Admitting: Internal Medicine

## 2013-07-18 NOTE — Telephone Encounter (Signed)
Is he having trouble with recurrent infection left elbow?

## 2013-07-18 NOTE — Telephone Encounter (Signed)
Left a message for patient to return call.

## 2013-07-19 NOTE — Telephone Encounter (Signed)
Left a message for patient to return call.

## 2013-07-19 NOTE — Telephone Encounter (Signed)
Ok for refill but needs ov to evaluate persistent infection vs other?

## 2013-07-19 NOTE — Telephone Encounter (Signed)
Patient returned call and states his hand is still swollen and trouble with elbow. Should Bactrim be refilled?

## 2013-07-29 ENCOUNTER — Other Ambulatory Visit: Payer: Self-pay | Admitting: Internal Medicine

## 2013-07-30 ENCOUNTER — Encounter: Payer: Self-pay | Admitting: Family Medicine

## 2013-07-30 ENCOUNTER — Ambulatory Visit (INDEPENDENT_AMBULATORY_CARE_PROVIDER_SITE_OTHER): Payer: BC Managed Care – PPO | Admitting: Family Medicine

## 2013-07-30 VITALS — BP 126/80 | Temp 98.9°F | Wt 166.0 lb

## 2013-07-30 DIAGNOSIS — L309 Dermatitis, unspecified: Secondary | ICD-10-CM

## 2013-07-30 DIAGNOSIS — L259 Unspecified contact dermatitis, unspecified cause: Secondary | ICD-10-CM

## 2013-07-30 MED ORDER — PREDNISONE 10 MG PO TABS: ORAL_TABLET | ORAL | Status: DC

## 2013-07-30 NOTE — Progress Notes (Signed)
   Subjective:    Patient ID: Donald Matthews, male    DOB: [birth year], 47 y.o.   MRN: [id number]  HPI Here for chronic eczema causing itching rashes on the trunk and extremities. He has seen a Dermatologist by the name of Dr. Gwen Pounds in [place] but not for several years. He typically uses moisturizer lotions and topical steroid preparations from Dr. Debby Bud, but this winter nothing has helped except oral prednisone. He was seen a few weeks ago for olecranon bursitis, and this has responded well to Bactrim DS. At that time he was given a 12 days taper of prednisone and this helped his skin quite a bit. However now that this is finished the eczema has flared up again.    Review of Systems  Constitutional: Negative.   Respiratory: Negative.   Skin: Positive for rash.       Objective:   Physical Exam  Constitutional: He appears well-developed and well-nourished. No distress.  Skin:  Widespread macular erythematous patches on the trunk, neck, and arms with some scaling          Assessment & Plan:  He does seem to have a stubborn case of eczema. We will give him more prednisone but after the taper he will stay on 10 mg of this daily for the time being. Advised him to see Dr. Gwen Pounds again ASAP

## 2013-07-30 NOTE — Progress Notes (Signed)
Pre visit review using our clinic review tool, if applicable. No additional management support is needed unless otherwise documented below in the visit note. 

## 2013-08-01 ENCOUNTER — Telehealth: Payer: Self-pay | Admitting: *Deleted

## 2013-08-01 NOTE — Telephone Encounter (Signed)
[facility] Triage Call Report Triage Record Num: [id number] Operator: Geanie Berlin Patient Name: Donald Matthews Call Date & Time: 07/30/2013 9:42:37AM Patient Phone: [phone] PCP: Illene Regulus Patient Gender: Male PCP Fax : [phone] Patient DOB: [birth year] [facility] Name[facility] Reason for Call: Caller: Elliott/Patient; PCP: Illene Regulus (Adults only); CB#: [phone]; Call regarding Medication Issue; Medication(s): Prednisone refill; Reports widespread, itchy, flakey, dry skin. Also notes large area of redness under eyes. Onset: 07/28/13. Afebrile. Reports he sees the MD about this itchy rash every month. On Bactrim BID for past 2 weeks; reduced dose to 1 daily over past few days. Advised to see MD within 4 hours since office is open for new onset of rash after beginning RX medication per Rash. Scheuduled for 1230 07/30/13 at [facility] with Dr Abran Cantor. Protocol(s) Used: Rash Recommended Outcome per Protocol: Call Provider within 4 Hours Reason for Outcome: New onset of rash after beginning new prescribed, nonprescribed, or alternative/complementary medication Care Advice: Cool/tepid showers or baths may help relieve itching. If cool water alone does not relieve itching, try adding 1/2 to 1 cup baking soda or colloidal oatmeal (Aveeno) to bath water. ~ Avoid use of perfumed or strong soaps, detergents or any product that is irritating to the skin. Only use mild, unscented soap (Aveeno, Neutrogena) for bathing to decrease irritation. ~ ~ Rash from a drug reaction may not appear for a week or more after a drug was started. Call provider if rash gets worse after offending drug has been discontinued, or if it continues for more than a week with no improvement. ~ ~ Speak with provider before next dose of medication is due. ~ SYMPTOM / CONDITION MANAGEMENT ~ Should not be alone for the next 24 hours in case symptoms worsen. ~ IMMEDIATE ACTION ~ List, or take, all  current prescription(s), nonprescription or alternative medication(s) to provider for evaluation. If an allergy is identified, tell all healthcare providers of your allergy. Even if a first-time reaction caused mild symptoms, a future response to the same allergen may cause more serious symptoms. Wear medical identification to alert others in case of an emergency. ~ Call EMS 911 if develop signs and symptoms of anaphylaxis within minutes to several hours of exposure: severe difficulty breathing; rapid, weak or irregular pulse; pruritus, urticaria, swelling of face, lips, tongue, or throat causing tightness or difficulty swallowing; abdominal cramping, nausea, vomiting or diarrhea. ~ If immediately available, consider taking an appropriate dose of a nonprescription antihistamine (e.g., Benadryl) orally as directed by the label or a pharmacist. These medications may cause drowsiness and should be taken with caution by adults 75 years or older.

## 2013-08-03 ENCOUNTER — Ambulatory Visit: Payer: BC Managed Care – PPO | Admitting: Internal Medicine

## 2013-09-06 ENCOUNTER — Ambulatory Visit (INDEPENDENT_AMBULATORY_CARE_PROVIDER_SITE_OTHER): Payer: BC Managed Care – PPO | Admitting: Internal Medicine

## 2013-09-06 ENCOUNTER — Encounter: Payer: Self-pay | Admitting: Internal Medicine

## 2013-09-06 VITALS — BP 130/84 | HR 88 | Temp 97.9°F | Ht 72.0 in | Wt 179.0 lb

## 2013-09-06 DIAGNOSIS — L03211 Cellulitis of face: Principal | ICD-10-CM

## 2013-09-06 DIAGNOSIS — L259 Unspecified contact dermatitis, unspecified cause: Secondary | ICD-10-CM

## 2013-09-06 DIAGNOSIS — L309 Dermatitis, unspecified: Secondary | ICD-10-CM

## 2013-09-06 DIAGNOSIS — L0201 Cutaneous abscess of face: Secondary | ICD-10-CM

## 2013-09-06 LAB — GLUCOSE, POCT (MANUAL RESULT ENTRY): POC Glucose: 92 mg/dl (ref 70–99)

## 2013-09-06 MED ORDER — KETOCONAZOLE 2 % EX CREA: 1.0000 "application " | TOPICAL_CREAM | Freq: Two times a day (BID) | CUTANEOUS | Status: DC

## 2013-09-06 MED ORDER — MUPIROCIN 2 % EX OINT: TOPICAL_OINTMENT | CUTANEOUS | Status: DC

## 2013-09-06 MED ORDER — DOXYCYCLINE HYCLATE 100 MG PO TABS: 100.0000 mg | ORAL_TABLET | Freq: Two times a day (BID) | ORAL | Status: DC

## 2013-09-06 NOTE — Assessment & Plan Note (Signed)
1-1.5

## 2013-09-06 NOTE — Progress Notes (Signed)
   Subjective:    HPI  R eye lesion - sore for a few days on his upper eye lid C/o L cheek lesion x 7-10 days    Review of Systems  Constitutional: Negative for fever, appetite change, fatigue and unexpected weight change.  HENT: Negative for congestion, nosebleeds, sneezing, sore throat and trouble swallowing.   Eyes: Negative for itching and visual disturbance.  Gastrointestinal: Negative for nausea, diarrhea, blood in stool and abdominal distention.  Genitourinary: Negative for frequency and hematuria.  Skin: Negative for pallor and rash.  Neurological: Negative for dizziness and weakness.  Psychiatric/Behavioral: Negative for dysphoric mood.       Objective:   Physical Exam  Constitutional: He is oriented to person, place, and time. He appears well-developed. No distress.  NAD  HENT:  Mouth/Throat: Oropharynx is clear and moist.  Eyes: Conjunctivae are normal. Pupils are equal, round, and reactive to light.  Neck: Normal range of motion. No JVD present. No thyromegaly present.  Cardiovascular: Normal rate, regular rhythm, normal heart sounds and intact distal pulses.  Exam reveals no gallop and no friction rub.   No murmur heard. Pulmonary/Chest: Effort normal and breath sounds normal. No respiratory distress. He has no wheezes. He has no rales. He exhibits no tenderness.  Abdominal: Soft. Bowel sounds are normal. He exhibits no distension and no mass. There is no tenderness. There is no rebound and no guarding.  Musculoskeletal: Normal range of motion. He exhibits no edema and no tenderness.  Lymphadenopathy:    He has no cervical adenopathy.  Neurological: He is alert and oriented to person, place, and time. He has normal reflexes. No cranial nerve deficit. He exhibits normal muscle tone. He displays a negative Romberg sign. Coordination and gait normal.  No meningeal signs  Skin: Skin is warm and dry. No rash noted.  Psychiatric: He has a normal mood and affect. His  behavior is normal. Judgment and thought content normal.    R  upper eye lid 5 mm eryth cyst  L cheek lesion x 7-10 - erosion 1.0x0.6 cm        Assessment & Plan:

## 2013-09-06 NOTE — Assessment & Plan Note (Signed)
Follows with Dr. Gwen [place]owalski [place] - chronic daily treatment with topical steroids, ammonium and tacrolimus

## 2013-09-06 NOTE — Progress Notes (Signed)
Pre visit review using our clinic review tool, if applicable. No additional management support is needed unless otherwise documented below in the visit note. 

## 2013-09-08 ENCOUNTER — Encounter: Payer: Self-pay | Admitting: Internal Medicine

## 2014-05-10 ENCOUNTER — Telehealth: Payer: Self-pay | Admitting: Internal Medicine

## 2014-05-10 MED ORDER — SILDENAFIL CITRATE 100 MG PO TABS: ORAL_TABLET | ORAL | Status: AC

## 2014-05-10 NOTE — Telephone Encounter (Signed)
Pt called in said that he is looking for a refill on this VIAGRA 100 MG tablet [[id number]][[id number]] and wasn't sure if he need an appt or he could just get it refilled.  He was Dr Debby BudNorins pt but has seen Dr Posey ReaPlotnikov

## 2014-05-10 NOTE — Telephone Encounter (Signed)
Notified pt rx has been sent, but need to make cpx with dr. Posey ReaPlotnikov...Raechel Chute/lmb

## 2016-06-30 ENCOUNTER — Ambulatory Visit (INDEPENDENT_AMBULATORY_CARE_PROVIDER_SITE_OTHER): Payer: BC Managed Care – PPO | Admitting: Nurse Practitioner

## 2016-06-30 ENCOUNTER — Encounter: Payer: Self-pay | Admitting: Nurse Practitioner

## 2016-06-30 VITALS — BP 148/84 | HR 92 | Temp 97.7°F | Ht 69.0 in | Wt 177.0 lb

## 2016-06-30 DIAGNOSIS — L309 Dermatitis, unspecified: Secondary | ICD-10-CM | POA: Diagnosis not present

## 2016-06-30 MED ORDER — PREDNISONE 10 MG PO TABS: ORAL_TABLET | ORAL | 0 refills | Status: DC

## 2016-06-30 MED ORDER — CETAPHIL MOISTURIZING EX CREA: TOPICAL_CREAM | CUTANEOUS | 0 refills | Status: DC | PRN

## 2016-06-30 NOTE — Patient Instructions (Signed)
Atopic Dermatitis Atopic dermatitis is a skin disorder that causes inflammation of the skin. This is the most common type of eczema. Eczema is a group of skin conditions that cause the skin to be itchy, red, and swollen. This condition is generally worse during the cooler winter months and often improves during the warm summer months. Symptoms can vary from person to person. Atopic dermatitis usually starts showing signs in infancy and can last through adulthood. This condition cannot be passed from one person to another (non-contagious), but is more common in families. Atopic dermatitis may not always be present. When it is present, it is called a flare-up. What are the causes? The exact cause of this condition is not known. Flare-ups of the condition may be triggered by:  Contact with something you are sensitive or allergic to.  Stress.  Certain foods.  Extremely hot or cold weather.  Harsh chemicals and soaps.  Dry air.  Chlorine. What increases the risk? This condition is more likely to develop in people who have a personal history or family history of eczema, allergies, asthma, or hay fever. What are the signs or symptoms? Symptoms of this condition include:  Dry, scaly skin.  Red, itchy rash.  Itchiness, which can be severe. This may occur before the skin rash. This can make sleeping difficult.  Skin thickening and cracking can occur over time. How is this diagnosed? This condition is diagnosed based on your symptoms, a medical history, and a physical exam. How is this treated? There is no cure for this condition, but symptoms can usually be controlled. Treatment focuses on:  Controlling the itching and scratching. You may be given medicines, such as antihistamines or steroid creams.  Limiting exposure to things that you are sensitive or allergic to (allergens).  Recognizing situations that cause stress and developing a plan to manage stress. If your atopic dermatitis  does not get better with medicines or is all over your body (widespread) , a treatment using a specific type of light (phototherapy) may be used. Follow these instructions at home: Skin care  Keep your skin well-moisturized. This seals in moisture and help prevent dryness.  Use unscented lotions that have petroleum in them.  Avoid lotions that contain alcohol and water. They can dry the skin.  Keep baths or showers short (less than 5 minutes) in warm water. Do not use hot water.  Use mild, unscented cleansers for bathing. Avoid soap and bubble bath.  Apply a moisturizer to your skin right after a bath or shower.   Do not apply anything to your skin without checking with your health care provider. General instructions  Dress in clothes made of cotton or cotton blends. Dress lightly because heat increases itching.  When washing your clothes, rinse your clothes twice so all of the soap is removed.  Avoid any triggers that can cause a flare-up.  Try to manage your stress.  Keep your fingernails cut short.  Avoid scratching. Scratching makes the rash and itching worse. It may also result in a skin infection (impetigo) due to a break in the skin caused by scratching.  Take or apply over-the-counter and prescription medicines only as told by your health care provider.  Keep all follow-up visits as told by your health care provider. This is important.  Do not be around people who have cold sores or fever blisters. If you get the infection, it may cause your atopic dermatitis to worsen. Contact a health care provider if:  Your itching   interferes with sleep.  Your rash gets worse or is not better within one week of starting treatment.  You have a fever.  You have a rash flare-up after having contact with someone who has cold sores or fever blisters. Get help right away if:  You develop pus or soft yellow scabs in the rash area. Summary  This condition causes a red rash and  itchy, dry, scaly skin.  Treatment focuses on controlling the itching and scratching, limiting exposure to things that you are sensitive or allergic to (allergens), and recognizing situations that cause stress and developing a plan to manage stress.  Keep your skin well-moisturized.  Keep baths or showers less than 5 minutes. This information is not intended to replace advice given to you by your health care provider. Make sure you discuss any questions you have with your health care provider. Document Released: 07/18/2000 Document Revised: 12/27/2015 Document Reviewed: 02/21/2013 Elsevier Interactive Patient Education  2017 [employer]  

## 2016-06-30 NOTE — Progress Notes (Signed)
Pre visit review using our clinic review tool, if applicable. No additional management support is needed unless otherwise documented below in the visit note. 
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
## 2016-07-15 ENCOUNTER — Encounter: Payer: Self-pay | Admitting: Internal Medicine

## 2016-07-15 ENCOUNTER — Other Ambulatory Visit (INDEPENDENT_AMBULATORY_CARE_PROVIDER_SITE_OTHER): Payer: BC Managed Care – PPO

## 2016-07-15 ENCOUNTER — Ambulatory Visit (INDEPENDENT_AMBULATORY_CARE_PROVIDER_SITE_OTHER): Payer: BC Managed Care – PPO | Admitting: Internal Medicine

## 2016-07-15 DIAGNOSIS — I10 Essential (primary) hypertension: Secondary | ICD-10-CM | POA: Diagnosis not present

## 2016-07-15 DIAGNOSIS — L309 Dermatitis, unspecified: Secondary | ICD-10-CM

## 2016-07-15 LAB — URINALYSIS
Bilirubin Urine: NEGATIVE
Hgb urine dipstick: NEGATIVE
KETONES UR: NEGATIVE
Leukocytes, UA: NEGATIVE
Nitrite: NEGATIVE
PH: 6 (ref 5.0–8.0)
SPECIFIC GRAVITY, URINE: 1.02 (ref 1.000–1.030)
TOTAL PROTEIN, URINE-UPE24: NEGATIVE
URINE GLUCOSE: NEGATIVE
Urobilinogen, UA: 0.2 (ref 0.0–1.0)

## 2016-07-15 LAB — BASIC METABOLIC PANEL
BUN: 15 mg/dL (ref 6–23)
CALCIUM: 9.3 mg/dL (ref 8.4–10.5)
CO2: 31 meq/L (ref 19–32)
Chloride: 104 mEq/L (ref 96–112)
Creatinine, Ser: 0.97 mg/dL (ref 0.40–1.50)
GFR: 105.55 mL/min (ref >60.00)
GLUCOSE: 93 mg/dL (ref 70–99)
POTASSIUM: 4 meq/L (ref 3.5–5.1)
SODIUM: 143 meq/L (ref 135–145)

## 2016-07-15 LAB — HEPATIC FUNCTION PANEL
ALK PHOS: 85 U/L (ref 39–117)
ALT: 79 U/L — ABNORMAL HIGH (ref 0–53)
AST: 39 U/L — ABNORMAL HIGH (ref 0–37)
Albumin: 4.3 g/dL (ref 3.5–5.2)
BILIRUBIN DIRECT: 0.1 mg/dL (ref 0.0–0.3)
BILIRUBIN TOTAL: 0.6 mg/dL (ref 0.2–1.2)
TOTAL PROTEIN: 7.3 g/dL (ref 6.0–8.3)

## 2016-07-15 LAB — LIPID PANEL
CHOL/HDL RATIO: 3
Cholesterol: 194 mg/dL (ref 0–200)
HDL: 67.5 mg/dL (ref >39.00)
LDL CALC: 110 mg/dL — AB (ref 0–99)
NONHDL: 126.55
TRIGLYCERIDES: 81 mg/dL (ref 0.0–149.0)
VLDL: 16.2 mg/dL (ref 0.0–40.0)

## 2016-07-15 LAB — HEMOGLOBIN A1C: Hgb A1c MFr Bld: 5.4 % (ref 4.6–6.5)

## 2016-07-15 LAB — TSH: TSH: 2.54 u[IU]/mL (ref 0.35–4.50)

## 2016-07-15 LAB — VITAMIN B12: Vitamin B-12: 411 pg/mL (ref 211–911)

## 2016-07-15 MED ORDER — TRIAMCINOLONE ACETONIDE 0.1 % EX OINT: 1.0000 "application " | TOPICAL_OINTMENT | Freq: Two times a day (BID) | CUTANEOUS | 3 refills | Status: DC

## 2016-07-15 MED ORDER — METHYLPREDNISOLONE ACETATE 80 MG/ML IJ SUSP
80.0000 mg | Freq: Once | INTRAMUSCULAR | Status: AC
  Administered 2016-07-15: 80 mg via INTRAMUSCULAR

## 2016-07-15 MED ORDER — PREDNISONE 10 MG PO TABS: ORAL_TABLET | ORAL | 1 refills | Status: DC

## 2016-07-15 MED ORDER — CETIRIZINE HCL 10 MG PO TABS: 10.0000 mg | ORAL_TABLET | Freq: Every day | ORAL | 3 refills | Status: DC

## 2016-07-15 NOTE — Progress Notes (Signed)
Pre visit review using our clinic review tool, if applicable. No additional management support is needed unless otherwise documented below in the visit note. 

## 2016-07-15 NOTE — Assessment & Plan Note (Signed)
Sebamed 

## 2016-07-15 NOTE — Patient Instructions (Addendum)
Get Sebamed skin care products - [facility], [facility]  Face and Body Wash   Moisturizing Cream  Moisturizing Body Lotion Cleansing BarHand and Nail Bal - 2.6 ounces   Gluten free trial (no wheat products) for 4-6 weeks. OK to use gluten-free bread and gluten-free pasta.  Milk free trial (no milk, ice cream, cheese and yogurt) for 4-6 weeks. OK to use almond, coconut, rice or soy milk. "Almond breeze" brand tastes good.
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
## 2016-07-16 ENCOUNTER — Telehealth: Payer: Self-pay | Admitting: Emergency Medicine

## 2016-07-16 NOTE — Telephone Encounter (Signed)
Patients mother called stating patient was suppose to pick up a kit from the drug store yesterday. They have been to several drug store and nobody seems to have it. She wants to know where to find it at. Please advise thanks.

## 2016-07-16 NOTE — Telephone Encounter (Signed)
I called the patient's mother. I informed her to check [email or web address] which is the official website to purchase the products Dr. Posey ReaPlotnikov recommended at his 07/15/16 OV.

## 2016-07-17 NOTE — Assessment & Plan Note (Signed)
NAS diet 

## 2016-07-18 LAB — ZINC: ZINC: 73 ug/dL (ref 60–130)

## 2016-07-21 ENCOUNTER — Other Ambulatory Visit: Payer: Self-pay | Admitting: Internal Medicine

## 2016-07-21 LAB — VITAMIN B1: VITAMIN B1 (THIAMINE): 8 nmol/L (ref 8–30)

## 2016-07-21 MED ORDER — B COMPLEX PO TABS: 1.0000 | ORAL_TABLET | Freq: Every day | ORAL | 3 refills | Status: DC

## 2016-08-15 ENCOUNTER — Ambulatory Visit: Payer: BC Managed Care – PPO | Admitting: Internal Medicine

## 2016-08-18 ENCOUNTER — Ambulatory Visit (INDEPENDENT_AMBULATORY_CARE_PROVIDER_SITE_OTHER): Payer: BC Managed Care – PPO | Admitting: Internal Medicine

## 2016-08-18 ENCOUNTER — Other Ambulatory Visit (INDEPENDENT_AMBULATORY_CARE_PROVIDER_SITE_OTHER): Payer: BC Managed Care – PPO

## 2016-08-18 ENCOUNTER — Encounter: Payer: Self-pay | Admitting: Internal Medicine

## 2016-08-18 DIAGNOSIS — E559 Vitamin D deficiency, unspecified: Secondary | ICD-10-CM

## 2016-08-18 DIAGNOSIS — L309 Dermatitis, unspecified: Secondary | ICD-10-CM

## 2016-08-18 DIAGNOSIS — I10 Essential (primary) hypertension: Secondary | ICD-10-CM

## 2016-08-18 DIAGNOSIS — E519 Thiamine deficiency, unspecified: Secondary | ICD-10-CM | POA: Diagnosis not present

## 2016-08-18 LAB — VITAMIN D 25 HYDROXY (VIT D DEFICIENCY, FRACTURES): VITD: 11.87 ng/mL — ABNORMAL LOW (ref 30.00–100.00)

## 2016-08-18 MED ORDER — VITAMIN B-1 100 MG PO TABS: 100.0000 mg | ORAL_TABLET | Freq: Every day | ORAL | 3 refills | Status: AC

## 2016-08-18 MED ORDER — VITAMIN D3 50 MCG (2000 UT) PO CAPS: 2000.0000 [IU] | ORAL_CAPSULE | Freq: Every day | ORAL | 3 refills | Status: AC

## 2016-08-18 MED ORDER — ERGOCALCIFEROL 1.25 MG (50000 UT) PO CAPS: 50000.0000 [IU] | ORAL_CAPSULE | ORAL | 0 refills | Status: AC

## 2016-08-18 NOTE — Assessment & Plan Note (Signed)
start Vit D prescription 50000 iu weekly (Rx emailed to  pharmacy) followed by over-the-counter Vit D 2000 iu daily.  

## 2016-08-18 NOTE — Progress Notes (Signed)
Pre visit review using our clinic review tool, if applicable. No additional management support is needed unless otherwise documented below in the visit note. 

## 2016-08-18 NOTE — Patient Instructions (Signed)
Shower once a day

## 2016-08-18 NOTE — Assessment & Plan Note (Signed)
Will re-check in 3 mo
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
## 2016-08-18 NOTE — Addendum Note (Signed)
Addended by: Tresa GarterPLOTNIKOV, [place] Fitting V on: 08/18/2016 09:40 PM   Modules accepted: Orders

## 2016-08-18 NOTE — Assessment & Plan Note (Signed)
Much better - 90% B1 replacement Sebamed Triam/Lotion

## 2016-08-18 NOTE — Assessment & Plan Note (Signed)
On B complex 

## 2017-04-14 ENCOUNTER — Ambulatory Visit (INDEPENDENT_AMBULATORY_CARE_PROVIDER_SITE_OTHER): Payer: BC Managed Care – PPO | Admitting: Internal Medicine

## 2017-04-14 ENCOUNTER — Ambulatory Visit (INDEPENDENT_AMBULATORY_CARE_PROVIDER_SITE_OTHER)
Admission: RE | Admit: 2017-04-14 | Discharge: 2017-04-14 | Disposition: A | Discharge status: Home / Self Care | Payer: BC Managed Care – PPO | Source: Ambulatory Visit | Attending: Internal Medicine | Admitting: Internal Medicine

## 2017-04-14 ENCOUNTER — Encounter: Payer: Self-pay | Admitting: Internal Medicine

## 2017-04-14 VITALS — BP 132/84 | HR 84 | Temp 98.1°F | Ht 69.0 in | Wt 184.0 lb

## 2017-04-14 DIAGNOSIS — E559 Vitamin D deficiency, unspecified: Secondary | ICD-10-CM | POA: Diagnosis not present

## 2017-04-14 DIAGNOSIS — M545 Low back pain, unspecified: Secondary | ICD-10-CM

## 2017-04-14 DIAGNOSIS — L309 Dermatitis, unspecified: Secondary | ICD-10-CM | POA: Diagnosis not present

## 2017-04-14 DIAGNOSIS — E519 Thiamine deficiency, unspecified: Secondary | ICD-10-CM

## 2017-04-14 DIAGNOSIS — I10 Essential (primary) hypertension: Secondary | ICD-10-CM | POA: Diagnosis not present

## 2017-04-14 DIAGNOSIS — G8929 Other chronic pain: Secondary | ICD-10-CM

## 2017-04-14 MED ORDER — IBUPROFEN 600 MG PO TABS: ORAL_TABLET | ORAL | 1 refills | Status: DC

## 2017-04-14 NOTE — Assessment & Plan Note (Addendum)
x 2 mo   [facility] X ray  Ibuprofen Rx Stretching RTC if not better

## 2017-04-14 NOTE — Assessment & Plan Note (Signed)
Vit B complex

## 2017-04-14 NOTE — Assessment & Plan Note (Signed)
Sebamed helped a lot

## 2017-04-14 NOTE — Assessment & Plan Note (Signed)
NAS diet 
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
## 2017-04-14 NOTE — Assessment & Plan Note (Signed)
On Vit D 

## 2017-04-16 ENCOUNTER — Telehealth: Payer: Self-pay | Admitting: Internal Medicine

## 2017-04-16 NOTE — Telephone Encounter (Signed)
Please advise about letter.  

## 2017-04-16 NOTE — Telephone Encounter (Signed)
Please write a letter Thx

## 2017-04-16 NOTE — Telephone Encounter (Signed)
Pt was given Xray results and expressed understanding.

## 2017-04-16 NOTE — Telephone Encounter (Signed)
Letter is ready to be signed. 

## 2017-04-16 NOTE — Telephone Encounter (Signed)
Pt wife called back and pt is needing a note stating what is going on with his back but he is sch to have training next week.  Pt is in [employer] and would like a note to give to his caption to excuse him from training next week due to is back  Training is next week   They would like to pick it up when it is

## 2017-04-17 NOTE — Telephone Encounter (Signed)
Wife notified letter is ready to be picked up

## 2017-07-15 ENCOUNTER — Encounter: Payer: Self-pay | Admitting: Internal Medicine

## 2017-08-17 ENCOUNTER — Other Ambulatory Visit: Payer: Self-pay | Admitting: Internal Medicine

## 2017-10-26 ENCOUNTER — Other Ambulatory Visit: Payer: Self-pay | Admitting: Internal Medicine

## 2017-10-26 NOTE — Telephone Encounter (Signed)
Patient has came in the office today wanting to know if refill will go through.  Patient is aware he needs to set up a CPE.  Please call patient back at [phone].

## 2017-10-27 ENCOUNTER — Telehealth: Payer: Self-pay | Admitting: Internal Medicine

## 2017-10-27 MED ORDER — FAMCICLOVIR 500 MG PO TABS: 500.0000 mg | ORAL_TABLET | Freq: Every day | ORAL | 0 refills | Status: DC

## 2017-10-27 NOTE — Telephone Encounter (Signed)
Ok to refill Thx 

## 2017-10-27 NOTE — Telephone Encounter (Signed)
RX SENT TO [facility].[name]./LMB

## 2017-10-27 NOTE — Telephone Encounter (Signed)
Copied from CRM [id number]. Topic: General - Other >> Oct 27, 2017  8:52 AM Cecelia ByarsGreen, Tatsuya Okray L, RMA wrote: Reason for CRM: Medication refill request for famciclovir (FAMVIR) 500 MG [facility]

## 2017-10-27 NOTE — Telephone Encounter (Signed)
Med has not been refilled since 07/2016. Pls advise if ok to refill.[name].Raechel Chute/lmb

## 2017-11-10 ENCOUNTER — Other Ambulatory Visit (INDEPENDENT_AMBULATORY_CARE_PROVIDER_SITE_OTHER): Payer: BC Managed Care – PPO

## 2017-11-10 ENCOUNTER — Ambulatory Visit: Payer: BC Managed Care – PPO | Admitting: Internal Medicine

## 2017-11-10 ENCOUNTER — Encounter: Payer: Self-pay | Admitting: Internal Medicine

## 2017-11-10 DIAGNOSIS — M545 Low back pain, unspecified: Secondary | ICD-10-CM

## 2017-11-10 DIAGNOSIS — L309 Dermatitis, unspecified: Secondary | ICD-10-CM

## 2017-11-10 DIAGNOSIS — E559 Vitamin D deficiency, unspecified: Secondary | ICD-10-CM

## 2017-11-10 DIAGNOSIS — I10 Essential (primary) hypertension: Secondary | ICD-10-CM

## 2017-11-10 DIAGNOSIS — E519 Thiamine deficiency, unspecified: Secondary | ICD-10-CM | POA: Diagnosis not present

## 2017-11-10 LAB — CBC WITH DIFFERENTIAL/PLATELET
BASOS PCT: 1.1 % (ref 0.0–3.0)
Basophils Absolute: 0.1 10*3/uL (ref 0.0–0.1)
EOS PCT: 5.5 % — AB (ref 0.0–5.0)
Eosinophils Absolute: 0.3 10*3/uL (ref 0.0–0.7)
HEMATOCRIT: 46.3 % (ref 39.0–52.0)
Hemoglobin: 15.6 g/dL (ref 13.0–17.0)
LYMPHS PCT: 40.6 % (ref 12.0–46.0)
Lymphs Abs: 1.9 10*3/uL (ref 0.7–4.0)
MCHC: 33.7 g/dL (ref 30.0–36.0)
MCV: 83.4 fl (ref 78.0–100.0)
Monocytes Absolute: 0.4 10*3/uL (ref 0.1–1.0)
Monocytes Relative: 8.7 % (ref 3.0–12.0)
NEUTROS ABS: 2.1 10*3/uL (ref 1.4–7.7)
Neutrophils Relative %: 44.1 % (ref 43.0–77.0)
PLATELETS: 287 10*3/uL (ref 150.0–400.0)
RBC: 5.55 Mil/uL (ref 4.22–5.81)
RDW: 13.2 % (ref 11.5–15.5)
WBC: 4.7 10*3/uL (ref 4.0–10.5)

## 2017-11-10 LAB — IBC PANEL
IRON: 90 ug/dL (ref 42–165)
Saturation Ratios: 26.8 % (ref 20.0–50.0)
Transferrin: 240 mg/dL (ref 212.0–360.0)

## 2017-11-10 LAB — HEPATIC FUNCTION PANEL
ALT: 67 U/L — ABNORMAL HIGH (ref 0–53)
AST: 39 U/L — AB (ref 0–37)
Albumin: 4.2 g/dL (ref 3.5–5.2)
Alkaline Phosphatase: 84 U/L (ref 39–117)
BILIRUBIN DIRECT: 0.1 mg/dL (ref 0.0–0.3)
BILIRUBIN TOTAL: 0.4 mg/dL (ref 0.2–1.2)
Total Protein: 7.3 g/dL (ref 6.0–8.3)

## 2017-11-10 LAB — BASIC METABOLIC PANEL
BUN: 10 mg/dL (ref 6–23)
CALCIUM: 8.8 mg/dL (ref 8.4–10.5)
CO2: 26 mEq/L (ref 19–32)
CREATININE: 0.99 mg/dL (ref 0.40–1.50)
Chloride: 107 mEq/L (ref 96–112)
GFR: 102.54 mL/min (ref >60.00)
Glucose, Bld: 89 mg/dL (ref 70–99)
Potassium: 4.2 mEq/L (ref 3.5–5.1)
Sodium: 142 mEq/L (ref 135–145)

## 2017-11-10 LAB — VITAMIN D 25 HYDROXY (VIT D DEFICIENCY, FRACTURES): VITD: 33.85 ng/mL (ref 30.00–100.00)

## 2017-11-10 MED ORDER — TRIAMCINOLONE ACETONIDE 0.1 % EX OINT: TOPICAL_OINTMENT | CUTANEOUS | 3 refills | Status: DC

## 2017-11-10 MED ORDER — FAMCICLOVIR 500 MG PO TABS: ORAL_TABLET | ORAL | 1 refills | Status: DC

## 2017-11-10 MED ORDER — IBUPROFEN 600 MG PO TABS: ORAL_TABLET | ORAL | 2 refills | Status: DC

## 2017-11-10 MED ORDER — TACROLIMUS 0.1 % EX OINT: TOPICAL_OINTMENT | Freq: Two times a day (BID) | CUTANEOUS | 1 refills | Status: AC | PRN

## 2017-11-10 NOTE — Assessment & Plan Note (Signed)
Sebamed helped a lot 

## 2017-11-10 NOTE — Assessment & Plan Note (Signed)
NAS 

## 2017-11-10 NOTE — Assessment & Plan Note (Signed)
On Rx 

## 2017-11-10 NOTE — Progress Notes (Signed)
Subjective:  Patient ID: Donald Matthews, male    DOB: March 21, 1966  Age: 52 y.o. MRN: [id number]  CC: No chief complaint on file.   HPI Donald Matthews presents for rash, vit D def, elevated BP f/u  Outpatient Medications Prior to Visit  Medication Sig Dispense Refill  . b complex vitamins tablet Take 1 tablet by mouth daily. 100 tablet 3  . Cholecalciferol (VITAMIN D3) 2000 units capsule Take 1 capsule (2,000 Units total) by mouth daily. 100 capsule 3  . ketoconazole (NIZORAL) 2 % cream Apply 1 application topically 2 (two) times daily. 30 g 0  . sildenafil (VIAGRA) 100 MG tablet TAKE 1 TABLET BY MOUTH AS NEEDED FOR ERECTILE DYSFUNCTION. 6 tablet 0  . thiamine (VITAMIN B-1) 100 MG tablet Take 1 tablet (100 mg total) by mouth daily. 100 tablet 3  . famciclovir (FAMVIR) 500 MG tablet TAKE 3 TABLETS AS DIRECTED AT ONSET OF SYMPTOMS 30 tablet 0  . ibuprofen (ADVIL,MOTRIN) 600 MG tablet Take twice a day x 1 weeks, then prn pain 60 tablet 1  . triamcinolone ointment (KENALOG) 0.1 % APPLY TO AFFECTED AREA TWICE A DAY. 454 g 3  . cetirizine (ZYRTEC ALLERGY) 10 MG tablet Take 1 tablet (10 mg total) by mouth daily. 100 tablet 3   No facility-administered medications prior to visit.     ROS Review of Systems  Constitutional: Negative for appetite change, fatigue and unexpected weight change.  HENT: Negative for congestion, nosebleeds, sneezing, sore throat and trouble swallowing.   Eyes: Negative for itching and visual disturbance.  Respiratory: Negative for cough.   Cardiovascular: Negative for chest pain, palpitations and leg swelling.  Gastrointestinal: Negative for abdominal distention, blood in stool, diarrhea and nausea.  Genitourinary: Negative for frequency and hematuria.  Musculoskeletal: Negative for back pain, gait problem, joint swelling and neck pain.  Skin: Negative for rash.  Neurological: Negative for dizziness, tremors, speech difficulty and weakness.    Psychiatric/Behavioral: Negative for agitation, dysphoric mood and sleep disturbance. The patient is not nervous/anxious.     Objective:  BP (!) 140/92 (BP Location: Left Arm, Patient Position: Sitting, Cuff Size: Large)   Pulse 98   Temp 98.4 F (36.9 C) (Oral)   Ht 5\' 9"  (1.753 m)   Wt 184 lb (83.5 kg)   SpO2 98%   BMI 27.17 kg/m   BP Readings from Last 3 Encounters:  11/10/17 (!) 140/92  04/14/17 132/84  08/18/16 (!) 148/100    Wt Readings from Last 3 Encounters:  11/10/17 184 lb (83.5 kg)  04/14/17 184 lb (83.5 kg)  08/18/16 174 lb (78.9 kg)    Physical Exam  Constitutional: He is oriented to person, place, and time. He appears well-developed. No distress.  NAD  HENT:  Mouth/Throat: Oropharynx is clear and moist.  Eyes: Pupils are equal, round, and reactive to light. Conjunctivae are normal.  Neck: Normal range of motion. No JVD present. No thyromegaly present.  Cardiovascular: Normal rate, regular rhythm, normal heart sounds and intact distal pulses. Exam reveals no gallop and no friction rub.  No murmur heard. Pulmonary/Chest: Effort normal and breath sounds normal. No respiratory distress. He has no wheezes. He has no rales. He exhibits no tenderness.  Abdominal: Soft. Bowel sounds are normal. He exhibits no distension and no mass. There is no tenderness. There is no rebound and no guarding.  Musculoskeletal: Normal range of motion. He exhibits no edema or tenderness.  Lymphadenopathy:    He has no cervical adenopathy.  Neurological: He is alert and oriented to person, place, and time. He has normal reflexes. No cranial nerve deficit. He exhibits normal muscle tone. He displays a negative Romberg sign. Coordination and gait normal.  Skin: Skin is warm and dry. No rash noted.  Psychiatric: He has a normal mood and affect. His behavior is normal. Judgment and thought content normal.    Lab Results  Component Value Date   GLUCOSE 93 07/15/2016   CHOL 194  07/15/2016   TRIG 81.0 07/15/2016   HDL 67.50 07/15/2016   LDLCALC 110 (H) 07/15/2016   ALT 79 (H) 07/15/2016   AST 39 (H) 07/15/2016   NA 143 07/15/2016   K 4.0 07/15/2016   CL 104 07/15/2016   CREATININE 0.97 07/15/2016   BUN 15 07/15/2016   CO2 31 07/15/2016   TSH 2.54 07/15/2016   HGBA1C 5.4 07/15/2016    Dg Lumbar Spine 2-3 Views  Result Date: 04/14/2017 CLINICAL DATA:  Low back pain for 2 months, no known injury, initial encounter EXAM: LUMBAR SPINE - 3 VIEW COMPARISON:  None. FINDINGS: Five lumbar type vertebral bodies are well visualized. Vertebral body height is well maintained with the exception of regularity of the L 5 vertebral bodies superiorly. This may represent a limbus vertebra. Facet hypertrophic changes are noted with mild anterolisthesis of L 5 on S1. Mild osteophytic changes are seen. No acute bony abnormality is noted. IMPRESSION: Chronic appearing changes at L5 as described. Electronically Signed   By: Alcide Clever M.D.   On: 04/14/2017 15:26    Assessment & Plan:   Diagnoses and all orders for this visit:  Chronic eczema -     VITAMIN D 25 Hydroxy (Vit-D Deficiency, Fractures); Future -     Vitamin B1, whole blood -     Basic metabolic panel; Future -     CBC with Differential/Platelet; Future -     Hepatic function panel; Future  Essential hypertension  Vitamin D deficiency -     VITAMIN D 25 Hydroxy (Vit-D Deficiency, Fractures); Future  Thiamin deficiency -     Vitamin B1, whole blood -     CBC with Differential/Platelet; Future -     Hepatic function panel; Future  Other orders -     triamcinolone ointment (KENALOG) 0.1 %; APPLY TO AFFECTED AREA TWICE A DAY. -     famciclovir (FAMVIR) 500 MG tablet; 1 po bid x5 days -     ibuprofen (ADVIL,MOTRIN) 600 MG tablet; Take twice a day x 1 weeks, then prn pain -     tacrolimus (PROTOPIC) 0.1 % ointment; Apply topically 2 (two) times daily as needed.   I have changed Donald Matthews's  famciclovir. I am also having him maintain his ketoconazole, sildenafil, cetirizine, b complex vitamins, thiamine, Vitamin D3, triamcinolone ointment, ibuprofen, and tacrolimus.  Meds ordered this encounter  Medications  . triamcinolone ointment (KENALOG) 0.1 %    Sig: APPLY TO AFFECTED AREA TWICE A DAY.    Dispense:  454 g    Refill:  3  . famciclovir (FAMVIR) 500 MG tablet    Sig: 1 po bid x5 days    Dispense:  30 tablet    Refill:  1  . ibuprofen (ADVIL,MOTRIN) 600 MG tablet    Sig: Take twice a day x 1 weeks, then prn pain    Dispense:  60 tablet    Refill:  2  . tacrolimus (PROTOPIC) 0.1 % ointment    Sig: Apply topically 2 (  two) times daily as needed.    Dispense:  100 g    Refill:  1     Follow-up: Return in about 6 months (around 05/12/2018) for Wellness Exam.  Sonda PrimesAlex Plotnikov, MD

## 2017-11-10 NOTE — Assessment & Plan Note (Signed)
Vit D 

## 2017-11-10 NOTE — Assessment & Plan Note (Signed)
Ibuprofen prn Rx 

## 2017-11-11 ENCOUNTER — Encounter: Payer: Self-pay | Admitting: Internal Medicine

## 2017-11-11 ENCOUNTER — Other Ambulatory Visit: Payer: Self-pay | Admitting: Internal Medicine

## 2017-11-11 DIAGNOSIS — R945 Abnormal results of liver function studies: Secondary | ICD-10-CM

## 2017-11-11 DIAGNOSIS — R7989 Other specified abnormal findings of blood chemistry: Secondary | ICD-10-CM | POA: Insufficient documentation

## 2017-11-13 ENCOUNTER — Telehealth: Payer: Self-pay | Admitting: Internal Medicine

## 2017-11-13 ENCOUNTER — Other Ambulatory Visit: Payer: Self-pay

## 2017-11-13 DIAGNOSIS — R945 Abnormal results of liver function studies: Principal | ICD-10-CM

## 2017-11-13 DIAGNOSIS — R7989 Other specified abnormal findings of blood chemistry: Secondary | ICD-10-CM

## 2017-11-13 NOTE — Telephone Encounter (Signed)
Copied from CRM [phone]. Topic: Quick Communication - Office Called Patient >> Nov 12, 2017  9:28 AM Karma GanjaSmith, Carla J, CMA wrote: Reason for CRM: Called and left message for patient to return call to clinic regarding his lab results. Okay to release info to him if he calls back. Overall his labs are normal however his liver function test is a little elevated. Dr. Posey ReaPlotnikov would like for him to please reduce alcohol if drinking. He is put in referral to have him set up for a liver US.    Phone is [phone]

## 2017-11-13 NOTE — Telephone Encounter (Signed)
Left message for pt to return call to office in order to receive lab results.

## 2017-12-01 ENCOUNTER — Encounter: Payer: Self-pay | Admitting: Internal Medicine

## 2017-12-16 ENCOUNTER — Encounter: Payer: Self-pay | Admitting: Family

## 2017-12-16 ENCOUNTER — Ambulatory Visit: Payer: BC Managed Care – PPO | Admitting: Family

## 2017-12-16 VITALS — BP 150/100 | HR 97 | Temp 97.9°F | Ht 69.0 in | Wt 182.0 lb

## 2017-12-16 DIAGNOSIS — R945 Abnormal results of liver function studies: Secondary | ICD-10-CM

## 2017-12-16 DIAGNOSIS — R7989 Other specified abnormal findings of blood chemistry: Secondary | ICD-10-CM

## 2017-12-16 DIAGNOSIS — I10 Essential (primary) hypertension: Secondary | ICD-10-CM

## 2017-12-16 MED ORDER — AMLODIPINE BESYLATE 5 MG PO TABS: 5.0000 mg | ORAL_TABLET | Freq: Every day | ORAL | 1 refills | Status: DC

## 2017-12-16 NOTE — Progress Notes (Signed)
Donald Matthews is a 51 y.o. male with the following history as recorded in Epic[employer]:  Patient Active Problem List   Diagnosis Date Noted  . Elevated LFTs 11/11/2017  . Low back pain 04/14/2017  . Thiamin deficiency 08/18/2016  . Vitamin D deficiency 08/18/2016  . Alopecia areata 02/24/2012  . Chronic eczema   . Erectile dysfunction 12/16/2010  . Essential hypertension 04/06/2009  . ALLERGIC RHINITIS 04/09/2007    Current Outpatient Medications  Medication Sig Dispense Refill  . b complex vitamins tablet Take 1 tablet by mouth daily. 100 tablet 3  . Cholecalciferol (VITAMIN D3) 2000 units capsule Take 1 capsule (2,000 Units total) by mouth daily. 100 capsule 3  . famciclovir (FAMVIR) 500 MG tablet 1 po bid x5 days 30 tablet 1  . ibuprofen (ADVIL,MOTRIN) 600 MG tablet Take twice a day x 1 weeks, then prn pain 60 tablet 2  . ketoconazole (NIZORAL) 2 % cream Apply 1 application topically 2 (two) times daily. 30 g 0  . sildenafil (VIAGRA) 100 MG tablet TAKE 1 TABLET BY MOUTH AS NEEDED FOR ERECTILE DYSFUNCTION. 6 tablet 0  . tacrolimus (PROTOPIC) 0.1 % ointment Apply topically 2 (two) times daily as needed. 100 g 1  . thiamine (VITAMIN B-1) 100 MG tablet Take 1 tablet (100 mg total) by mouth daily. 100 tablet 3  . triamcinolone ointment (KENALOG) 0.1 % APPLY TO AFFECTED AREA TWICE A DAY. 454 g 3  . amLODipine (NORVASC) 5 MG tablet Take 1 tablet (5 mg total) by mouth daily. 30 tablet 1  . cetirizine (ZYRTEC ALLERGY) 10 MG tablet Take 1 tablet (10 mg total) by mouth daily. 100 tablet 3   No current facility-administered medications for this visit.     Allergies: Penicillins  Past Medical History:  Diagnosis Date  . Allergic rhinitis   . Chronic eczema    sees Dr. Gwen Pounds (Dermatology) in [place]   . Headache(784.0)   . HTN (hypertension)     History reviewed. No pertinent surgical history.  Family History  Problem Relation Age of Onset  . Hypertension Father   .  Hyperlipidemia Father   . Hypertension Mother   . Heart attack Neg Hx   . Coronary artery disease Neg Hx   . Colon cancer Neg Hx   . Prostate cancer Neg Hx   . Diabetes Neg Hx   . Cancer Neg Hx        prostate, colon     Social History   Tobacco Use  . Smoking status: Never Smoker  . Smokeless tobacco: Never Used  Substance Use Topics  . Alcohol use: Yes    Subjective:  Patient presents with concerns for elevated blood pressure/ headaches x 1 week; denies any chest pain or shortness of breath; notes that his stress level has been very high recently- thinks it is job related; does have a history of HTN and has taken HCTZ in the past; not sure when or why the medication was stopped? Feels like there is a pressure just sitting on top of his head;  Also needs to be re-scheduled for abdominal ultrasound due to elevated LFTs; was not contacted as discussed last month; notes he does not drink alcohol during the week/ can drink up to a 6 pack in a weekend;      Objective:  Vitals:   12/16/17 1017  BP: (!) 150/100  Pulse: 97  Temp: 97.9 F (36.6 C)  TempSrc: Oral  SpO2: 99%  Weight: 182 lb (82.6  kg)  Height:  (1.753 m)    General: Well developed, well nourished, in no acute distress  Skin : Warm and dry.  Head: Normocephalic and atraumatic  Eyes: Sclera and conjunctiva clear; pupils round and reactive to light; extraocular movements intact  Ears: External normal; canals clear; tympanic membranes normal  Oropharynx: Pink, supple. No suspicious lesions  Neck: Supple without thyromegaly, adenopathy  Lungs: Respirations unlabored; clear to auscultation bilaterally without wheeze, rales, rhonchi  CVS exam: normal rate and regular rhythm.  Neurologic: Alert and oriented; speech intact; face symmetrical; moves all extremities well; CNII-XII intact without focal deficit  Assessment:  1. Essential hypertension   2. Elevated LFTs     Plan:  1. Suspect source of headache;  reviewed labs from last office visit; update EKG today- no acute changes seen; Rx for Amlodipine 5 mg daily; DASH diet/ stress reduction discussed; follow-up in 3 weeks, sooner prn. 2. Update abdominal ultrasound; follow-up with his PCP after; encouraged to limit alcohol intake;  Return in about 3 weeks (around 01/06/2018) for blood pressure follow-up.  Orders Placed This Encounter  Procedures  . US Abdomen Complete    Standing Status:   Future    Standing Expiration Date:   02/16/2019    Order Specific Question:   Reason for Exam (SYMPTOM  OR DIAGNOSIS REQUIRED)    Answer:   elevated LFTs    Order Specific Question:   Preferred imaging location?    Answer:   [facility]  . EKG 12-Lead    Requested Prescriptions   Signed Prescriptions Disp Refills  . amLODipine (NORVASC) 5 MG tablet 30 tablet 1    Sig: Take 1 tablet (5 mg total) by mouth daily.

## 2017-12-16 NOTE — Patient Instructions (Signed)
DASH Eating Plan DASH stands for "Dietary Approaches to Stop Hypertension." The DASH eating plan is a healthy eating plan that has been shown to reduce high blood pressure (hypertension). It may also reduce your risk for type 2 diabetes, heart disease, and stroke. The DASH eating plan may also help with weight loss. What are tips for following this plan? General guidelines  Avoid eating more than 2,300 mg (milligrams) of salt (sodium) a day. If you have hypertension, you may need to reduce your sodium intake to 1,500 mg a day.  Limit alcohol intake to no more than 1 drink a day for nonpregnant women and 2 drinks a day for men. One drink equals 12 oz of beer, 5 oz of wine, or 1 oz of hard liquor.  Work with your health care provider to maintain a healthy body weight or to lose weight. Ask what an ideal weight is for you.  Get at least 30 minutes of exercise that causes your heart to beat faster (aerobic exercise) most days of the week. Activities may include walking, swimming, or biking.  Work with your health care provider or diet and nutrition specialist (dietitian) to adjust your eating plan to your individual calorie needs. Reading food labels  Check food labels for the amount of sodium per serving. Choose foods with less than 5 percent of the Daily Value of sodium. Generally, foods with less than 300 mg of sodium per serving fit into this eating plan.  To find whole grains, look for the word "whole" as the first word in the ingredient list. Shopping  Buy products labeled as "low-sodium" or "no salt added."  Buy fresh foods. Avoid canned foods and premade or frozen meals. Cooking  Avoid adding salt when cooking. Use salt-free seasonings or herbs instead of table salt or sea salt. Check with your health care provider or pharmacist before using salt substitutes.  Do not fry foods. Cook foods using healthy methods such as baking, boiling, grilling, and broiling instead.  Cook with  heart-healthy oils, such as olive, canola, soybean, or sunflower oil. Meal planning   Eat a balanced diet that includes: ? 5 or more servings of fruits and vegetables each day. At each meal, try to fill half of your plate with fruits and vegetables. ? Up to 6-8 servings of whole grains each day. ? Less than 6 oz of lean meat, poultry, or fish each day. A 3-oz serving of meat is about the same size as a deck of cards. One egg equals 1 oz. ? 2 servings of low-fat dairy each day. ? A serving of nuts, seeds, or beans 5 times each week. ? Heart-healthy fats. Healthy fats called Omega-3 fatty acids are found in foods such as flaxseeds and coldwater fish, like sardines, salmon, and mackerel.  Limit how much you eat of the following: ? Canned or prepackaged foods. ? Food that is high in trans fat, such as fried foods. ? Food that is high in saturated fat, such as fatty meat. ? Sweets, desserts, sugary drinks, and other foods with added sugar. ? Full-fat dairy products.  Do not salt foods before eating.  Try to eat at least 2 vegetarian meals each week.  Eat more home-cooked food and less restaurant, buffet, and fast food.  When eating at a restaurant, ask that your food be prepared with less salt or no salt, if possible. What foods are recommended? The items listed may not be a complete list. Talk with your dietitian about what   dietary choices are best for you. Grains Whole-grain or whole-wheat bread. Whole-grain or whole-wheat pasta. Brown rice. Oatmeal. Quinoa. Bulgur. Whole-grain and low-sodium cereals. Pita bread. Low-fat, low-sodium crackers. Whole-wheat flour tortillas. Vegetables Fresh or frozen vegetables (raw, steamed, roasted, or grilled). Low-sodium or reduced-sodium tomato and vegetable juice. Low-sodium or reduced-sodium tomato sauce and tomato paste. Low-sodium or reduced-sodium canned vegetables. Fruits All fresh, dried, or frozen fruit. Canned fruit in natural juice (without  added sugar). Meat and other protein foods Skinless chicken or turkey. Ground chicken or turkey. Pork with fat trimmed off. Fish and seafood. Egg whites. Dried beans, peas, or lentils. Unsalted nuts, nut butters, and seeds. Unsalted canned beans. Lean cuts of beef with fat trimmed off. Low-sodium, lean deli meat. Dairy Low-fat (1%) or fat-free (skim) milk. Fat-free, low-fat, or reduced-fat cheeses. Nonfat, low-sodium ricotta or cottage cheese. Low-fat or nonfat yogurt. Low-fat, low-sodium cheese. Fats and oils Soft margarine without trans fats. Vegetable oil. Low-fat, reduced-fat, or light mayonnaise and salad dressings (reduced-sodium). Canola, safflower, olive, soybean, and sunflower oils. Avocado. Seasoning and other foods Herbs. Spices. Seasoning mixes without salt. Unsalted popcorn and pretzels. Fat-free sweets. What foods are not recommended? The items listed may not be a complete list. Talk with your dietitian about what dietary choices are best for you. Grains Baked goods made with fat, such as croissants, muffins, or some breads. Dry pasta or rice meal packs. Vegetables Creamed or fried vegetables. Vegetables in a cheese sauce. Regular canned vegetables (not low-sodium or reduced-sodium). Regular canned tomato sauce and paste (not low-sodium or reduced-sodium). Regular tomato and vegetable juice (not low-sodium or reduced-sodium). Pickles. Olives. Fruits Canned fruit in a light or heavy syrup. Fried fruit. Fruit in cream or butter sauce. Meat and other protein foods Fatty cuts of meat. Ribs. Fried meat. Bacon. Sausage. Bologna and other processed lunch meats. Salami. Fatback. Hotdogs. Bratwurst. Salted nuts and seeds. Canned beans with added salt. Canned or smoked fish. Whole eggs or egg yolks. Chicken or turkey with skin. Dairy Whole or 2% milk, cream, and half-and-half. Whole or full-fat cream cheese. Whole-fat or sweetened yogurt. Full-fat cheese. Nondairy creamers. Whipped toppings.  Processed cheese and cheese spreads. Fats and oils Butter. Stick margarine. Lard. Shortening. Ghee. Bacon fat. Tropical oils, such as coconut, palm kernel, or palm oil. Seasoning and other foods Salted popcorn and pretzels. Onion salt, garlic salt, seasoned salt, table salt, and sea salt. Worcestershire sauce. Tartar sauce. Barbecue sauce. Teriyaki sauce. Soy sauce, including reduced-sodium. Steak sauce. Canned and packaged gravies. Fish sauce. Oyster sauce. Cocktail sauce. Horseradish that you find on the shelf. Ketchup. Mustard. Meat flavorings and tenderizers. Bouillon cubes. Hot sauce and Tabasco sauce. Premade or packaged marinades. Premade or packaged taco seasonings. Relishes. Regular salad dressings. Where to find more information:  National Heart, Lung, and Blood Institute: www.nhlbi.nih.gov  American Heart Association: www.heart.org Summary  The DASH eating plan is a healthy eating plan that has been shown to reduce high blood pressure (hypertension). It may also reduce your risk for type 2 diabetes, heart disease, and stroke.  With the DASH eating plan, you should limit salt (sodium) intake to 2,300 mg a day. If you have hypertension, you may need to reduce your sodium intake to 1,500 mg a day.  When on the DASH eating plan, aim to eat more fresh fruits and vegetables, whole grains, lean proteins, low-fat dairy, and heart-healthy fats.  Work with your health care provider or diet and nutrition specialist (dietitian) to adjust your eating plan to your individual   calorie needs. This information is not intended to replace advice given to you by your health care provider. Make sure you discuss any questions you have with your health care provider. Document Released: 07/10/2011 Document Revised: 07/14/2016 Document Reviewed: 07/14/2016 Elsevier Interactive Patient Education  2018 Elsevier Inc.  

## 2018-01-04 ENCOUNTER — Ambulatory Visit
Admission: RE | Admit: 2018-01-04 | Discharge: 2018-01-04 | Disposition: A | Discharge status: Home / Self Care | Payer: BC Managed Care – PPO | Source: Ambulatory Visit | Attending: Family | Admitting: Family

## 2018-01-04 DIAGNOSIS — R945 Abnormal results of liver function studies: Principal | ICD-10-CM

## 2018-01-04 DIAGNOSIS — R7989 Other specified abnormal findings of blood chemistry: Secondary | ICD-10-CM

## 2018-01-18 ENCOUNTER — Ambulatory Visit: Payer: BC Managed Care – PPO | Admitting: Internal Medicine

## 2018-01-18 ENCOUNTER — Encounter: Payer: Self-pay | Admitting: Internal Medicine

## 2018-01-18 DIAGNOSIS — I10 Essential (primary) hypertension: Secondary | ICD-10-CM

## 2018-01-18 DIAGNOSIS — E559 Vitamin D deficiency, unspecified: Secondary | ICD-10-CM | POA: Diagnosis not present

## 2018-01-18 DIAGNOSIS — R945 Abnormal results of liver function studies: Secondary | ICD-10-CM | POA: Diagnosis not present

## 2018-01-18 DIAGNOSIS — R7989 Other specified abnormal findings of blood chemistry: Secondary | ICD-10-CM

## 2018-01-18 MED ORDER — AMLODIPINE BESYLATE 5 MG PO TABS: 5.0000 mg | ORAL_TABLET | Freq: Every day | ORAL | 3 refills | Status: DC

## 2018-01-18 MED ORDER — TRIAMCINOLONE ACETONIDE 0.1 % EX OINT: TOPICAL_OINTMENT | CUTANEOUS | 3 refills | Status: DC

## 2018-01-18 NOTE — Progress Notes (Signed)
Subjective:  Patient ID: Donald Matthews, male    DOB: 1966-06-27  Age: 52 y.o. MRN: [id number]  CC: No chief complaint on file.   HPI Donald Matthews presents for HTN, rash f/u C/o HAs before Donald Matthews started Norvasc  Outpatient Medications Prior to Visit  Medication Sig Dispense Refill  . amLODipine (NORVASC) 5 MG tablet Take 1 tablet (5 mg total) by mouth daily. 30 tablet 1  . b complex vitamins tablet Take 1 tablet by mouth daily. 100 tablet 3  . Cholecalciferol (VITAMIN D3) 2000 units capsule Take 1 capsule (2,000 Units total) by mouth daily. 100 capsule 3  . famciclovir (FAMVIR) 500 MG tablet 1 po bid x5 days 30 tablet 1  . ibuprofen (ADVIL,MOTRIN) 600 MG tablet Take twice a day x 1 weeks, then prn pain 60 tablet 2  . ketoconazole (NIZORAL) 2 % cream Apply 1 application topically 2 (two) times daily. 30 g 0  . sildenafil (VIAGRA) 100 MG tablet TAKE 1 TABLET BY MOUTH AS NEEDED FOR ERECTILE DYSFUNCTION. 6 tablet 0  . tacrolimus (PROTOPIC) 0.1 % ointment Apply topically 2 (two) times daily as needed. 100 g 1  . thiamine (VITAMIN B-1) 100 MG tablet Take 1 tablet (100 mg total) by mouth daily. 100 tablet 3  . triamcinolone ointment (KENALOG) 0.1 % APPLY TO AFFECTED AREA TWICE A DAY. 454 g 3  . cetirizine (ZYRTEC ALLERGY) 10 MG tablet Take 1 tablet (10 mg total) by mouth daily. 100 tablet 3   No facility-administered medications prior to visit.     ROS: Review of Systems  Constitutional: Negative for appetite change, fatigue and unexpected weight change.  HENT: Negative for congestion, nosebleeds, sneezing, sore throat and trouble swallowing.   Eyes: Negative for itching and visual disturbance.  Respiratory: Negative for cough.   Cardiovascular: Negative for chest pain, palpitations and leg swelling.  Gastrointestinal: Negative for abdominal distention, blood in stool, diarrhea and nausea.  Genitourinary: Negative for frequency and hematuria.  Musculoskeletal: Negative for back  pain, gait problem, joint swelling and neck pain.  Skin: Negative for rash.  Neurological: Positive for headaches. Negative for dizziness, tremors, speech difficulty and weakness.  Psychiatric/Behavioral: Negative for agitation, dysphoric mood and sleep disturbance. The patient is not nervous/anxious.     Objective:  BP 126/84 (BP Location: Left Arm, Patient Position: Sitting, Cuff Size: Large)   Pulse 87   Temp 98 F (36.7 C) (Oral)   Ht 5\' 9"  (1.753 m)   Wt 182 lb (82.6 kg)   SpO2 98%   BMI 26.88 kg/m   BP Readings from Last 3 Encounters:  01/18/18 126/84  12/16/17 (!) 150/100  11/10/17 (!) 140/92    Wt Readings from Last 3 Encounters:  01/18/18 182 lb (82.6 kg)  12/16/17 182 lb (82.6 kg)  11/10/17 184 lb (83.5 kg)    Physical Exam  Constitutional: Donald Matthews is oriented to person, place, and time. Donald Matthews appears well-developed. No distress.  NAD  HENT:  Mouth/Throat: Oropharynx is clear and moist.  Eyes: Pupils are equal, round, and reactive to light. Conjunctivae are normal.  Neck: Normal range of motion. No JVD present. No thyromegaly present.  Cardiovascular: Normal rate, regular rhythm, normal heart sounds and intact distal pulses. Exam reveals no gallop and no friction rub.  No murmur heard. Pulmonary/Chest: Effort normal and breath sounds normal. No respiratory distress. Donald Matthews has no wheezes. Donald Matthews has no rales. Donald Matthews exhibits no tenderness.  Abdominal: Soft. Bowel sounds are normal. Donald Matthews exhibits no distension  and no mass. There is no tenderness. There is no rebound and no guarding.  Musculoskeletal: Normal range of motion. Donald Matthews exhibits no edema or tenderness.  Lymphadenopathy:    Donald Matthews has no cervical adenopathy.  Neurological: Donald Matthews is alert and oriented to person, place, and time. Donald Matthews has normal reflexes. No cranial nerve deficit. Donald Matthews exhibits normal muscle tone. Donald Matthews displays a negative Romberg sign. Coordination and gait normal.  Skin: Skin is warm and dry. No rash noted.  Psychiatric: Donald Matthews  has a normal mood and affect. His behavior is normal. Judgment and thought content normal.    Lab Results  Component Value Date   WBC 4.7 11/10/2017   HGB 15.6 11/10/2017   HCT 46.3 11/10/2017   PLT 287.0 11/10/2017   GLUCOSE 89 11/10/2017   CHOL 194 07/15/2016   TRIG 81.0 07/15/2016   HDL 67.50 07/15/2016   LDLCALC 110 (H) 07/15/2016   ALT 67 (H) 11/10/2017   AST 39 (H) 11/10/2017   NA 142 11/10/2017   K 4.2 11/10/2017   CL 107 11/10/2017   CREATININE 0.99 11/10/2017   BUN 10 11/10/2017   CO2 26 11/10/2017   TSH 2.54 07/15/2016   HGBA1C 5.4 07/15/2016    [place] Abdomen Complete  Result Date: 01/04/2018 CLINICAL DATA:  Elevated liver function studies EXAM: ABDOMEN ULTRASOUND COMPLETE COMPARISON:  None. FINDINGS: Gallbladder: No gallstones or wall thickening visualized. No sonographic Murphy sign noted by sonographer. Common bile duct: Diameter: 2.4 mm Liver: The hepatic echotexture is mildly increased diffusely. Adjacent to the gallbladder fossa there is a subtle area of decreased echogenicity. There is no intrahepatic ductal dilation. The surface contour remains smooth. Portal vein is patent on color Doppler imaging with normal direction of blood flow towards the liver. IVC: No abnormality visualized. Pancreas: Visualization of the pancreatic head and body is normal. The pancreatic tail is obscured by bowel gas. Spleen: Size and appearance within normal limits. Right Kidney: Length: 12.1 cm. Echogenicity within normal limits. No mass or hydronephrosis visualized. Left Kidney: Length: 12.0 cm. Echogenicity within normal limits. No mass or hydronephrosis visualized. Abdominal aorta: Limited visualization of the distal aorta and bifurcation due to bowel gas. The proximal and mid aorta exhibit no evidence of aneurysm. Other findings: None. IMPRESSION: No gallstones or sonographic evidence of acute cholecystitis. Increased hepatic echotexture most compatible with fatty infiltrative change.  Probable focal fatty sparing adjacent to the gallbladder. Normal appearance of the kidneys and IVC. Limited visualization of the pancreatic tail and distal abdominal aorta due to bowel gas. Electronically Signed   By: David  Swazil[place]Jordan M.D.   On: 01/04/2018 08:52    Assessment & Plan:   There are no diagnoses linked to this encounter.   No orders of the defined types were placed in this encounter.    Follow-up: No follow-ups on file.  Sonda PrimesAlex Friend Dorfman, MD

## 2018-01-18 NOTE — Assessment & Plan Note (Signed)
NAS diet Norvasc

## 2018-01-18 NOTE — Assessment & Plan Note (Signed)
Vit D 

## 2018-05-05 ENCOUNTER — Ambulatory Visit: Payer: Self-pay

## 2018-05-05 NOTE — Telephone Encounter (Signed)
Patient called in with c/o "facial swelling." He says "I woke up this morning and the left side of my face was swollen, not that bad but noticeable with pain when I touch it at a 6. I took 2 Benadryl and thought that would help. There is no itching, no fever, no redness." I asked about other symptoms, he denies. According to protocol, see PCP within 24 hours, no availability with PCP, appointment scheduled for tomorrow at 0800 with Alphonse Guild, NP, care advice given, patient verbalized understanding.   Reason for Disposition . Swelling is painful to touch  Answer Assessment - Initial Assessment Questions 1. ONSET: "When did the swelling start?" (e.g., minutes, hours, days)     This morning when I woke up 2. LOCATION: "What part of the face is swollen?"     Left cheek 3. SEVERITY: "How swollen is it?"     Not terrible, but noticeable 4. ITCHING: "Is there any itching?" If so, ask: "How much?"   (Scale 1-10; mild, moderate or severe)     No 5. PAIN: "Is the swelling painful to touch?" If so, ask: "How painful is it?"   (Scale 1-10; mild, moderate or severe)     Yes a 6 6. FEVER: "Do you have a fever?" If so, ask: "What is it, how was it measured, and when did it start?"      No 7. CAUSE: "What do you think is causing the face swelling?"     I don't know 8. RECURRENT SYMPTOM: "Have you had face swelling before?" If so, ask: "When was the last time?" "What happened that time?"     Never 9. OTHER SYMPTOMS: "Do you have any other symptoms?" (e.g., toothache, leg swelling)     No 10. PREGNANCY: "Is there any chance you are pregnant?" "When was your last menstrual period?"      N/A  Protocols used: [facility]

## 2018-05-06 ENCOUNTER — Other Ambulatory Visit (INDEPENDENT_AMBULATORY_CARE_PROVIDER_SITE_OTHER): Payer: BC Managed Care – PPO

## 2018-05-06 ENCOUNTER — Ambulatory Visit (INDEPENDENT_AMBULATORY_CARE_PROVIDER_SITE_OTHER)
Admission: RE | Admit: 2018-05-06 | Discharge: 2018-05-06 | Disposition: A | Discharge status: Home / Self Care | Payer: BC Managed Care – PPO | Source: Ambulatory Visit | Attending: Nurse Practitioner | Admitting: Nurse Practitioner

## 2018-05-06 ENCOUNTER — Other Ambulatory Visit: Payer: Self-pay | Admitting: Nurse Practitioner

## 2018-05-06 ENCOUNTER — Encounter: Payer: Self-pay | Admitting: Nurse Practitioner

## 2018-05-06 ENCOUNTER — Ambulatory Visit: Payer: BC Managed Care – PPO | Admitting: Nurse Practitioner

## 2018-05-06 VITALS — BP 140/100 | HR 111 | Ht 69.0 in | Wt 181.0 lb

## 2018-05-06 DIAGNOSIS — R220 Localized swelling, mass and lump, head: Secondary | ICD-10-CM | POA: Diagnosis not present

## 2018-05-06 DIAGNOSIS — R6884 Jaw pain: Secondary | ICD-10-CM

## 2018-05-06 DIAGNOSIS — J329 Chronic sinusitis, unspecified: Secondary | ICD-10-CM

## 2018-05-06 DIAGNOSIS — M25512 Pain in left shoulder: Secondary | ICD-10-CM | POA: Diagnosis not present

## 2018-05-06 LAB — COMPREHENSIVE METABOLIC PANEL
ALK PHOS: 83 U/L (ref 39–117)
ALT: 29 U/L (ref 0–53)
AST: 28 U/L (ref 0–37)
Albumin: 4.4 g/dL (ref 3.5–5.2)
BUN: 15 mg/dL (ref 6–23)
CO2: 28 mEq/L (ref 19–32)
Calcium: 9.6 mg/dL (ref 8.4–10.5)
Chloride: 102 mEq/L (ref 96–112)
Creatinine, Ser: 1.08 mg/dL (ref 0.40–1.50)
GFR: 92.57 mL/min (ref >60.00)
GLUCOSE: 91 mg/dL (ref 70–99)
Potassium: 3.5 mEq/L (ref 3.5–5.1)
Sodium: 138 mEq/L (ref 135–145)
Total Bilirubin: 0.5 mg/dL (ref 0.2–1.2)
Total Protein: 7.8 g/dL (ref 6.0–8.3)

## 2018-05-06 LAB — CBC
HCT: 43.6 % (ref 39.0–52.0)
Hemoglobin: 14.9 g/dL (ref 13.0–17.0)
MCHC: 34.2 g/dL (ref 30.0–36.0)
MCV: 82.9 fl (ref 78.0–100.0)
Platelets: 297 K/uL (ref 150.0–400.0)
RBC: 5.25 Mil/uL (ref 4.22–5.81)
RDW: 13.2 % (ref 11.5–15.5)
WBC: 9.8 K/uL (ref 4.0–10.5)

## 2018-05-06 LAB — TROPONIN I: TNIDX: 0.01 ug/L (ref 0.00–0.06)

## 2018-05-06 MED ORDER — DOXYCYCLINE HYCLATE 100 MG PO TABS: 100.0000 mg | ORAL_TABLET | Freq: Two times a day (BID) | ORAL | 0 refills | Status: DC

## 2018-05-06 NOTE — Progress Notes (Signed)
Name: Donald Matthews   MRN: [id number]    DOB: 07/12/1966   Date:05/06/2018       Progress Note  Subjective  Chief Complaint  Chief Complaint  Patient presents with  . Facial Swelling    left side x 2 days  . Shoulder Pain    left x 2 days  . Jaw Pain    HPI Donald Matthews is here today for evaluation of acute complaint of left sided facial pain, left jaw pain and swelling and left shoulder pain, first noticed when waking yesterday am. He also noticed bruising around left eye and hemorrhage to left eye when waking yesterday am. He denies any known injuries, says he was normal when he went to bed, denies drug use, alcohol, sleep aids, does not play any sports.  He says he overall feels well, did notice trouble eating due to jaw pain while chewing yesterday. He denies LOC, dizziness, weakness, headaches, vision changes, nausea, vomiting, chest pain, shortness of breath. He has not noticed any other abnormal bruising, bleeding or wounds to the rest of his body Tried benadryl with no relief.  Patient Active Problem List   Diagnosis Date Noted  . Elevated LFTs 11/11/2017  . Low back pain 04/14/2017  . Thiamin deficiency 08/18/2016  . Vitamin D deficiency 08/18/2016  . Alopecia areata 02/24/2012  . Chronic eczema   . Erectile dysfunction 12/16/2010  . Essential hypertension 04/06/2009  . ALLERGIC RHINITIS 04/09/2007    Social History   Tobacco Use  . Smoking status: Never Smoker  . Smokeless tobacco: Never Used  Substance Use Topics  . Alcohol use: Yes     Current Outpatient Medications:  .  amLODipine (NORVASC) 5 MG tablet, Take 1 tablet (5 mg total) by mouth daily., Disp: 90 tablet, Rfl: 3 .  b complex vitamins tablet, Take 1 tablet by mouth daily., Disp: 100 tablet, Rfl: 3 .  Cholecalciferol (VITAMIN D3) 2000 units capsule, Take 1 capsule (2,000 Units total) by mouth daily., Disp: 100 capsule, Rfl: 3 .  famciclovir (FAMVIR) 500 MG tablet, 1 po bid x5 days, Disp: 30 tablet,  Rfl: 1 .  ibuprofen (ADVIL,MOTRIN) 600 MG tablet, Take twice a day x 1 weeks, then prn pain, Disp: 60 tablet, Rfl: 2 .  ketoconazole (NIZORAL) 2 % cream, Apply 1 application topically 2 (two) times daily., Disp: 30 g, Rfl: 0 .  tacrolimus (PROTOPIC) 0.1 % ointment, Apply topically 2 (two) times daily as needed., Disp: 100 g, Rfl: 1 .  thiamine (VITAMIN B-1) 100 MG tablet, Take 1 tablet (100 mg total) by mouth daily., Disp: 100 tablet, Rfl: 3 .  triamcinolone ointment (KENALOG) 0.1 %, APPLY TO AFFECTED AREA TWICE A DAY., Disp: 454 g, Rfl: 3 .  cetirizine (ZYRTEC ALLERGY) 10 MG tablet, Take 1 tablet (10 mg total) by mouth daily., Disp: 100 tablet, Rfl: 3 .  sildenafil (VIAGRA) 100 MG tablet, TAKE 1 TABLET BY MOUTH AS NEEDED FOR ERECTILE DYSFUNCTION. (Patient not taking: Reported on 05/06/2018), Disp: 6 tablet, Rfl: 0  Allergies  Allergen Reactions  . Penicillins     REACTION: swelling    ROS  No other specific complaints in a complete review of systems (except as listed in HPI above).  Objective  Vitals:   05/06/18 0758  BP: (!) 140/100  Pulse: (!) 111  SpO2: 97%  Weight: 181 lb (82.1 kg)  Height: 5\' 9"  (1.753 m)    Body mass index is 26.73 kg/m.  Nursing Note and Vital Signs  reviewed.  Physical Exam  Constitutional: Patient appears well-developed and well-nourished.  No distress.  HEENT: head atraumatic, normocephalic, pupils equal and reactive to light, EOM's intact, left conjunctival hemorrhage,TM's without erythema or bulging, neck supple without lymphadenopathy, oropharynx pink and moist without exudate Cardiovascular: Normal rate, regular rhythm, S1/S2 present.  No murmur or rub heard. No BLE edema. Pulmonary/Chest: Effort normal and breath sounds clear. No respiratory distress or retractions. Musculoskeletal: Normal range of motion,  No gross deformities Neurological: He is alert and oriented to person, place, and time. No cranial nerve deficit. Coordination, balance,  strength, speech and gait are normal.  Skin: Skin is warm and dry. No rash noted. No erythema. Ecchymosis around left eye. Psychiatric: Patient has a normal mood and affect. behavior is normal. Judgment and thought content normal.   Assessment & Plan Unsure etiology, additional labs and imaging ordered today, F/U with further recommendations pending test results  1. Jaw pain - EKG 12-Lead-I have personally reviewed the EKG tracing and agree with reading as follows: sinus rhythm, no acute changes, compared to 12/16/17 EKG Return precautions including calling 911 for chest pain or shortness of breath were discussed. - Troponin I; Future - CBC; Future - Comprehensive metabolic panel; Future - DG Facial Bones 1-2 Views; Future  2. Facial swelling - CBC; Future - Comprehensive metabolic panel; Future - DG Facial Bones 1-2 Views; Future  3. Acute pain of left shoulder  -Red flags and when to present for emergency care or RTC including fever >101.40F, chest pain, shortness of breath, new/worsening/un-resolving symptoms, reviewed  and provided in AVS.

## 2018-05-06 NOTE — Patient Instructions (Addendum)
Please head downstairs for LABS AND XRAYS If any of your test results are critically abnormal, you will be contacted right away.  I will follow up with further plans when tests return.

## 2018-05-06 NOTE — Progress Notes (Signed)
orders

## 2018-05-14 ENCOUNTER — Other Ambulatory Visit: Payer: Self-pay | Admitting: Nurse Practitioner

## 2018-05-14 ENCOUNTER — Telehealth: Payer: Self-pay | Admitting: Internal Medicine

## 2018-05-14 DIAGNOSIS — J329 Chronic sinusitis, unspecified: Secondary | ICD-10-CM

## 2018-05-14 NOTE — Telephone Encounter (Signed)
Copied from CRM [id number]. Topic: Quick Communication - Rx Refill/Question >> May 14, 2018 11:30 AM Burchel, Abbi R wrote: [facility] - [place], [place] - [place] [address] [place] [address] Phone: [phone] Fax: [phone]  Medication: doxycycline (VIBRA-TABS) 100 MG tablet  Pt still experiencing some facial swelling, and is almost finished with this medication.  He would like to take another round.

## 2018-05-14 NOTE — Telephone Encounter (Signed)
Donald Matthews is aware patient is requesting another refill. She states he needs another OV if he is no better after completing the first round of doxy. I called pt to advise of this and to get more information. Left mess for patient to call back.

## 2018-05-14 NOTE — Telephone Encounter (Signed)
Donald Matthews filled RX

## 2018-05-14 NOTE — Telephone Encounter (Signed)
Left mess for patient to call back.  Why does he need refill? Sxs not resolved?  Did he see his dentist?

## 2018-07-13 ENCOUNTER — Encounter: Payer: Self-pay | Admitting: Internal Medicine

## 2018-07-13 ENCOUNTER — Ambulatory Visit: Payer: BC Managed Care – PPO | Admitting: Internal Medicine

## 2018-07-13 VITALS — BP 128/82 | HR 84 | Temp 98.4°F | Resp 16 | Ht 69.0 in | Wt 181.0 lb

## 2018-07-13 DIAGNOSIS — H9312 Tinnitus, left ear: Secondary | ICD-10-CM | POA: Diagnosis not present

## 2018-07-13 MED ORDER — ALPRAZOLAM 0.5 MG PO TABS: 0.5000 mg | ORAL_TABLET | Freq: Every evening | ORAL | 0 refills | Status: DC | PRN

## 2018-07-13 NOTE — Patient Instructions (Addendum)
Take the xanax nightly for the next 5 nights.   If your hissing sound does not improve call us and we can refer you to an ENT.    Tinnitus Tinnitus refers to hearing a sound when there is no actual source for that sound. This is often described as ringing in the ears. However, people with this condition may hear a variety of noises. A person may hear the sound in one ear or in both ears. The sounds of tinnitus can be soft, loud, or somewhere in between. Tinnitus can last for a few seconds or can be constant for days. It may go away without treatment and come back at various times. When tinnitus is constant or happens often, it can lead to other problems, such as trouble sleeping and trouble concentrating. Almost everyone experiences tinnitus at some point. Tinnitus that is long-lasting (chronic) or comes back often is a problem that may require medical attention. What are the causes? The cause of tinnitus is often not known. In some cases, it can result from other problems or conditions, including:  Exposure to loud noises from machinery, music, or other sources.  Hearing loss.  Ear or sinus infections.  Earwax buildup.  A foreign object in the ear.  Use of certain medicines.  Use of alcohol and caffeine.  High blood pressure.  Heart diseases.  Anemia.  Allergies.  Meniere disease.  Thyroid problems.  Tumors.  An enlarged part of a weakened blood vessel (aneurysm).  What are the signs or symptoms? The main symptom of tinnitus is hearing a sound when there is no source for that sound. It may sound like:  Buzzing.  Roaring.  Ringing.  Blowing air, similar to the sound heard when you listen to a seashell.  Hissing.  Whistling.  Sizzling.  Humming.  Running water.  A sustained musical note.  How is this diagnosed? Tinnitus is diagnosed based on your symptoms. Your health care provider will do a physical exam. A comprehensive hearing exam (audiologic exam)  will be done if your tinnitus:  Affects only one ear (unilateral).  Causes hearing difficulties.  Lasts 6 months or longer.  You may also need to see a health care provider who specializes in hearing disorders (audiologist). You may be asked to complete a questionnaire to determine the severity of your tinnitus. Tests may be done to help determine the cause and to rule out other conditions. These can include:  Imaging studies of your head and brain, such as: ? A CT scan. ? An MRI.  An imaging study of your blood vessels (angiogram).  How is this treated? Treating an underlying medical condition can sometimes make tinnitus go away. If your tinnitus continues, other treatments may include:  Medicines, such as certain antidepressants or sleeping aids.  Sound generators to mask the tinnitus. These include: ? Tabletop sound machines that play relaxing sounds to help you fall asleep. ? Wearable devices that fit in your ear and play sounds or music. ? A small device that uses headphones to deliver a signal embedded in music (acoustic neural stimulation). In time, this may change the pathways of your brain and make you less sensitive to tinnitus. This device is used for very severe cases when no other treatment is working.  Therapy and counseling to help you manage the stress of living with tinnitus.  Using hearing aids or cochlear implants, if your tinnitus is related to hearing loss.  Follow these instructions at home:  When possible, avoid being  in loud places and being exposed to loud sounds.  Wear hearing protection, such as earplugs, when you are exposed to loud noises.  Do not take stimulants, such as nicotine, alcohol, or caffeine.  Practice techniques for reducing stress, such as meditation, yoga, or deep breathing.  Use a white noise machine, a humidifier, or other devices to mask the sound of tinnitus.  Sleep with your head slightly raised. This may reduce the impact of  tinnitus.  Try to get plenty of rest each night. Contact a health care provider if:  You have tinnitus in just one ear.  Your tinnitus continues for 3 weeks or longer without stopping.  Home care measures are not helping.  You have tinnitus after a head injury.  You have tinnitus along with any of the following: ? Dizziness. ? Loss of balance. ? Nausea and vomiting. This information is not intended to replace advice given to you by your health care provider. Make sure you discuss any questions you have with your health care provider. Document Released: 07/21/2005 Document Revised: 03/23/2016 Document Reviewed: 12/21/2013 Elsevier Interactive Patient Education  2018 [employer].

## 2018-07-13 NOTE — Assessment & Plan Note (Addendum)
For the past couple of weeks he has been experiencing some symptoms of eustachian tube dysfunction, which have improved, but now he is experiencing tinnitus in his left ear-a constant hissing sound that is disturbing his sleep He is pleading for anything that will help Possible cause is the ibuprofen he has been taking twice daily for the past 2 months-he will discontinue that No signs of ear infection, excessive cerumen or eustachian tube dysfunction at this time We will prescribe alprazolam for 5 nights to help him sleep and this has shown to help according to up-to-date.  Advised that this will not be a chronic prescription and that is potentially addicting If his symptoms do not improve over the next several days after stopping ibuprofen can refer to ENT to make sure there is no other cause

## 2018-07-13 NOTE — Progress Notes (Signed)
Subjective:    Patient ID: Donald Matthews, male    DOB: 01/13/1966, 52 y.o.   MRN: [id number]  HPI The patient is here for an acute visit.  He has been having some ear symptoms for approximately 2 weeks.  Initially he had some discomfort in the ear was popping intermittently, but that has improved.  Now he just has a hissing sound in his left ear that is constant.  It has become more severe and he is having difficulty sleeping.  Last night he states he did not sleep at all.  He denies any ear pain or change in his hearing.  He denies fevers, nasal congestion, sinus pain, sore throat, cough or dizziness.  He does not get frequent headaches and has gotten in the habit of taking ibuprofen 600 mg twice daily-he has done that regularly for the past 2 months.  He states the headaches come from his blood pressure being elevated.  He has tried taking Sudafed a couple of times, Afrin and earwax removal drops for his current symptoms.  The hissing sound needs to be treated because he is not sleeping.  Medications and allergies reviewed with patient and updated if appropriate.  Patient Active Problem List   Diagnosis Date Noted  . Elevated LFTs 11/11/2017  . Low back pain 04/14/2017  . Thiamin deficiency 08/18/2016  . Vitamin D deficiency 08/18/2016  . Alopecia areata 02/24/2012  . Chronic eczema   . Erectile dysfunction 12/16/2010  . Essential hypertension 04/06/2009  . ALLERGIC RHINITIS 04/09/2007    Current Outpatient Medications on File Prior to Visit  Medication Sig Dispense Refill  . amLODipine (NORVASC) 5 MG tablet Take 1 tablet (5 mg total) by mouth daily. 90 tablet 3  . b complex vitamins tablet Take 1 tablet by mouth daily. 100 tablet 3  . Cholecalciferol (VITAMIN D3) 2000 units capsule Take 1 capsule (2,000 Units total) by mouth daily. 100 capsule 3  . doxycycline (VIBRA-TABS) 100 MG tablet Take 1 tablet (100 mg total) by mouth 2 (two) times daily. 20 tablet 0  .  famciclovir (FAMVIR) 500 MG tablet 1 po bid x5 days 30 tablet 1  . ibuprofen (ADVIL,MOTRIN) 600 MG tablet Take twice a day x 1 weeks, then prn pain 60 tablet 2  . ketoconazole (NIZORAL) 2 % cream Apply 1 application topically 2 (two) times daily. 30 g 0  . sildenafil (VIAGRA) 100 MG tablet TAKE 1 TABLET BY MOUTH AS NEEDED FOR ERECTILE DYSFUNCTION. 6 tablet 0  . tacrolimus (PROTOPIC) 0.1 % ointment Apply topically 2 (two) times daily as needed. 100 g 1  . thiamine (VITAMIN B-1) 100 MG tablet Take 1 tablet (100 mg total) by mouth daily. 100 tablet 3  . triamcinolone ointment (KENALOG) 0.1 % APPLY TO AFFECTED AREA TWICE A DAY. 454 g 3  . cetirizine (ZYRTEC ALLERGY) 10 MG tablet Take 1 tablet (10 mg total) by mouth daily. 100 tablet 3   No current facility-administered medications on file prior to visit.     Past Medical History:  Diagnosis Date  . Allergic rhinitis   . Chronic eczema    sees Dr. Gwen Pounds (Dermatology) in [place]   . Headache(784.0)   . HTN (hypertension)     No past surgical history on file.  Social History   Socioeconomic History  . Marital status: Single    Spouse name: Not on file  . Number of children: 0  . Years of education: Not on file  . Highest  education level: Not on file  Occupational History  . Occupation: Personnel officercorrection officer  Social Needs  . Financial resource strain: Not on file  . Food insecurity:    Worry: Not on file    Inability: Not on file  . Transportation needs:    Medical: Not on file    Non-medical: Not on file  Tobacco Use  . Smoking status: Never Smoker  . Smokeless tobacco: Never Used  Substance and Sexual Activity  . Alcohol use: Yes  . Drug use: No  . Sexual activity: Not on file  Lifestyle  . Physical activity:    Days per week: Not on file    Minutes per session: Not on file  . Stress: Not on file  Relationships  . Social connections:    Talks on phone: Not on file    Gets together: Not on file    Attends  religious service: Not on file    Active member of club or organization: Not on file    Attends meetings of clubs or organizations: Not on file    Relationship status: Not on file  Other Topics Concern  . Not on file  Social History Narrative   HSG, 3 years college   Married - '01 - 5 years/divorced   No children          Family History  Problem Relation Age of Onset  . Hypertension Father   . Hyperlipidemia Father   . Hypertension Mother   . Heart attack Neg Hx   . Coronary artery disease Neg Hx   . Colon cancer Neg Hx   . Prostate cancer Neg Hx   . Diabetes Neg Hx   . Cancer Neg Hx        prostate, colon     Review of Systems  Constitutional: Negative for chills and fever.  HENT: Positive for ear pain (hissing and popping in left ear). Negative for congestion, hearing loss, sinus pressure, sinus pain and sore throat.   Respiratory: Negative for cough, shortness of breath and wheezing.   Neurological: Positive for headaches. Negative for dizziness and light-headedness.       Objective:   Vitals:   07/13/18 0842  BP: 128/82  Pulse: 84  Resp: 16  Temp: 98.4 F (36.9 C)  SpO2: 98%   BP Readings from Last 3 Encounters:  07/13/18 128/82  05/06/18 (!) 140/100  01/18/18 126/84   Wt Readings from Last 3 Encounters:  07/13/18 181 lb (82.1 kg)  05/06/18 181 lb (82.1 kg)  01/18/18 182 lb (82.6 kg)   Body mass index is 26.73 kg/m.   Physical Exam    GENERAL APPEARANCE: Appears stated age, well appearing, NAD EYES: conjunctiva clear, no icterus HEENT: bilateral tympanic membranes and ear canals normal, oropharynx with no erythema, no thyromegaly, trachea midline, no cervical or supraclavicular lymphadenopathy LUNGS: Clear to auscultation without wheeze or crackles, unlabored breathing, good air entry bilaterally CARDIOVASCULAR: Normal S1,S2 without murmurs, no edema SKIN: Warm, dry      Assessment & Plan:    See Problem List for Assessment and Plan of  chronic medical problems.

## 2018-07-20 ENCOUNTER — Encounter: Payer: Self-pay | Admitting: Internal Medicine

## 2018-07-20 ENCOUNTER — Ambulatory Visit (INDEPENDENT_AMBULATORY_CARE_PROVIDER_SITE_OTHER): Payer: BC Managed Care – PPO | Admitting: Internal Medicine

## 2018-07-20 VITALS — BP 124/78 | HR 100 | Temp 97.8°F | Ht 69.0 in | Wt 180.0 lb

## 2018-07-20 DIAGNOSIS — I10 Essential (primary) hypertension: Secondary | ICD-10-CM

## 2018-07-20 DIAGNOSIS — H9312 Tinnitus, left ear: Secondary | ICD-10-CM

## 2018-07-20 DIAGNOSIS — E519 Thiamine deficiency, unspecified: Secondary | ICD-10-CM | POA: Diagnosis not present

## 2018-07-20 MED ORDER — PREDNISONE 10 MG PO TABS: ORAL_TABLET | ORAL | 0 refills | Status: DC

## 2018-07-20 MED ORDER — GABAPENTIN 100 MG PO CAPS: 100.0000 mg | ORAL_CAPSULE | Freq: Three times a day (TID) | ORAL | 3 refills | Status: DC | PRN

## 2018-07-20 MED ORDER — DOXYCYCLINE HYCLATE 100 MG PO TABS: 100.0000 mg | ORAL_TABLET | Freq: Two times a day (BID) | ORAL | 1 refills | Status: DC

## 2018-07-20 MED ORDER — PSEUDOEPHEDRINE HCL ER 120 MG PO TB12: 120.0000 mg | ORAL_TABLET | Freq: Two times a day (BID) | ORAL | 1 refills | Status: AC | PRN

## 2018-07-20 MED ORDER — CETIRIZINE HCL 10 MG PO TABS: 10.0000 mg | ORAL_TABLET | Freq: Every day | ORAL | 3 refills | Status: DC

## 2018-07-20 NOTE — Patient Instructions (Signed)
Tinnitus Tinnitus refers to hearing a sound when there is no actual source for that sound. This is often described as ringing in the ears. However, people with this condition may hear a variety of noises. A person may hear the sound in one ear or in both ears. The sounds of tinnitus can be soft, loud, or somewhere in between. Tinnitus can last for a few seconds or can be constant for days. It may go away without treatment and come back at various times. When tinnitus is constant or happens often, it can lead to other problems, such as trouble sleeping and trouble concentrating. Almost everyone experiences tinnitus at some point. Tinnitus that is long-lasting (chronic) or comes back often is a problem that may require medical attention. What are the causes? The cause of tinnitus is often not known. In some cases, it can result from other problems or conditions, including:  Exposure to loud noises from machinery, music, or other sources.  Hearing loss.  Ear or sinus infections.  Earwax buildup.  A foreign object in the ear.  Use of certain medicines.  Use of alcohol and caffeine.  High blood pressure.  Heart diseases.  Anemia.  Allergies.  Meniere disease.  Thyroid problems.  Tumors.  An enlarged part of a weakened blood vessel (aneurysm).  What are the signs or symptoms? The main symptom of tinnitus is hearing a sound when there is no source for that sound. It may sound like:  Buzzing.  Roaring.  Ringing.  Blowing air, similar to the sound heard when you listen to a seashell.  Hissing.  Whistling.  Sizzling.  Humming.  Running water.  A sustained musical note.  How is this diagnosed? Tinnitus is diagnosed based on your symptoms. Your health care provider will do a physical exam. A comprehensive hearing exam (audiologic exam) will be done if your tinnitus:  Affects only one ear (unilateral).  Causes hearing difficulties.  Lasts 6 months or  longer.  You may also need to see a health care provider who specializes in hearing disorders (audiologist). You may be asked to complete a questionnaire to determine the severity of your tinnitus. Tests may be done to help determine the cause and to rule out other conditions. These can include:  Imaging studies of your head and brain, such as: ? A CT scan. ? An MRI.  An imaging study of your blood vessels (angiogram).  How is this treated? Treating an underlying medical condition can sometimes make tinnitus go away. If your tinnitus continues, other treatments may include:  Medicines, such as certain antidepressants or sleeping aids.  Sound generators to mask the tinnitus. These include: ? Tabletop sound machines that play relaxing sounds to help you fall asleep. ? Wearable devices that fit in your ear and play sounds or music. ? A small device that uses headphones to deliver a signal embedded in music (acoustic neural stimulation). In time, this may change the pathways of your brain and make you less sensitive to tinnitus. This device is used for very severe cases when no other treatment is working.  Therapy and counseling to help you manage the stress of living with tinnitus.  Using hearing aids or cochlear implants, if your tinnitus is related to hearing loss.  Follow these instructions at home:  When possible, avoid being in loud places and being exposed to loud sounds.  Wear hearing protection, such as earplugs, when you are exposed to loud noises.  Do not take stimulants, such as nicotine,   alcohol, or caffeine.  Practice techniques for reducing stress, such as meditation, yoga, or deep breathing.  Use a white noise machine, a humidifier, or other devices to mask the sound of tinnitus.  Sleep with your head slightly raised. This may reduce the impact of tinnitus.  Try to get plenty of rest each night. Contact a health care provider if:  You have tinnitus in just one  ear.  Your tinnitus continues for 3 weeks or longer without stopping.  Home care measures are not helping.  You have tinnitus after a head injury.  You have tinnitus along with any of the following: ? Dizziness. ? Loss of balance. ? Nausea and vomiting. This information is not intended to replace advice given to you by your health care provider. Make sure you discuss any questions you have with your health care provider. Document Released: 07/21/2005 Document Revised: 03/23/2016 Document Reviewed: 12/21/2013 Elsevier Interactive Patient Education  2018 [employer]  

## 2018-07-20 NOTE — Assessment & Plan Note (Signed)
norvasc 

## 2018-07-20 NOTE — Assessment & Plan Note (Addendum)
Doxy Prednisone 10 mg: take 4 tabs a day x 3 days; then 3 tabs a day x 4 days; then 2 tabs a day x 4 days, then 1 tab a day x 6 days, then stop. Take pc. Sudafed ENT ref Gabapentin RTC 1 mo Stop driving w/window open

## 2018-07-20 NOTE — Progress Notes (Signed)
Subjective:  Patient ID: Donald Matthews, male    DOB: March 08, 1966  Age: 52 y.o. MRN: [id number]  CC: No chief complaint on file.   HPI Donald Matthews presents for hissing in the L ear x 2 mouth He had popping - it is gone, slight pain. He can't sleep No ONG:EXBMWUXLBP:stopped Ibuprofen 12/10 Driving w/window open all the time, [place] [address] [place] 7[place]wk  Outpatient Medications Prior to Visit  Medication Sig Dispense Refill  . ALPRAZolam (XANAX) 0.5 MG tablet Take 1 tablet (0.5 mg total) by mouth at bedtime as needed for anxiety. 5 tablet 0  . amLODipine (NORVASC) 5 MG tablet Take 1 tablet (5 mg total) by mouth daily. 90 tablet 3  . b complex vitamins tablet Take 1 tablet by mouth daily. (Patient not taking: Reported on 07/20/2018) 100 tablet 3  . cetirizine (ZYRTEC ALLERGY) 10 MG tablet Take 1 tablet (10 mg total) by mouth daily. 100 tablet 3  . Cholecalciferol (VITAMIN D3) 2000 units capsule Take 1 capsule (2,000 Units total) by mouth daily. (Patient not taking: Reported on 07/20/2018) 100 capsule 3  . famciclovir (FAMVIR) 500 MG tablet 1 po bid x5 days (Patient not taking: Reported on 07/20/2018) 30 tablet 1  . ibuprofen (ADVIL,MOTRIN) 600 MG tablet Take twice a day x 1 weeks, then prn pain (Patient not taking: Reported on 07/20/2018) 60 tablet 2  . ketoconazole (NIZORAL) 2 % cream Apply 1 application topically 2 (two) times daily. (Patient not taking: Reported on 07/20/2018) 30 g 0  . sildenafil (VIAGRA) 100 MG tablet TAKE 1 TABLET BY MOUTH AS NEEDED FOR ERECTILE DYSFUNCTION. (Patient not taking: Reported on 07/20/2018) 6 tablet 0  . tacrolimus (PROTOPIC) 0.1 % ointment Apply topically 2 (two) times daily as needed. (Patient not taking: Reported on 07/20/2018) 100 g 1  . thiamine (VITAMIN B-1) 100 MG tablet Take 1 tablet (100 mg total) by mouth daily. (Patient not taking: Reported on 07/20/2018) 100 tablet 3  . triamcinolone ointment (KENALOG) 0.1 % APPLY TO AFFECTED AREA TWICE A DAY. (Patient not  taking: Reported on 07/20/2018) 454 g 3   No facility-administered medications prior to visit.     ROS: Review of Systems  Constitutional: Negative for appetite change, fatigue, fever and unexpected weight change.  HENT: Positive for tinnitus. Negative for congestion, ear pain, nosebleeds, sinus pressure, sinus pain, sneezing, sore throat, trouble swallowing and voice change.   Eyes: Negative for itching and visual disturbance.  Respiratory: Negative for cough.   Cardiovascular: Negative for chest pain, palpitations and leg swelling.  Gastrointestinal: Negative for abdominal distention, blood in stool, diarrhea and nausea.  Genitourinary: Negative for frequency and hematuria.  Musculoskeletal: Negative for back pain, gait problem, joint swelling and neck pain.  Skin: Negative for rash.  Neurological: Negative for dizziness, tremors, speech difficulty and weakness.  Psychiatric/Behavioral: Negative for agitation, dysphoric mood and sleep disturbance. The patient is not nervous/anxious.     Objective:  BP 124/78 (BP Location: Left Arm, Patient Position: Sitting, Cuff Size: Large)   Pulse 100   Temp 97.8 F (36.6 C) (Oral)   Ht 5\' 9"  (1.753 m)   Wt 180 lb (81.6 kg)   SpO2 98%   BMI 26.58 kg/m   BP Readings from Last 3 Encounters:  07/20/18 124/78  07/13/18 128/82  05/06/18 (!) 140/100    Wt Readings from Last 3 Encounters:  07/20/18 180 lb (81.6 kg)  07/13/18 181 lb (82.1 kg)  05/06/18 181 lb (82.1 kg)  Physical Exam Constitutional:      General: He is not in acute distress.    Appearance: He is well-developed.     Comments: NAD  Eyes:     Conjunctiva/sclera: Conjunctivae normal.     Pupils: Pupils are equal, round, and reactive to light.  Neck:     Musculoskeletal: Normal range of motion.     Thyroid: No thyromegaly.     Vascular: No JVD.  Cardiovascular:     Rate and Rhythm: Normal rate and regular rhythm.     Heart sounds: Normal heart sounds. No murmur. No  friction rub. No gallop.   Pulmonary:     Effort: Pulmonary effort is normal. No respiratory distress.     Breath sounds: Normal breath sounds. No wheezing or rales.  Chest:     Chest wall: No tenderness.  Abdominal:     General: Bowel sounds are normal. There is no distension.     Palpations: Abdomen is soft. There is no mass.     Tenderness: There is no abdominal tenderness. There is no guarding or rebound.  Musculoskeletal: Normal range of motion.        General: No tenderness.  Lymphadenopathy:     Cervical: No cervical adenopathy.  Skin:    General: Skin is warm and dry.     Findings: No rash.  Neurological:     Mental Status: He is alert and oriented to person, place, and time.     Cranial Nerves: No cranial nerve deficit.     Motor: No abnormal muscle tone.     Coordination: Coordination normal.     Gait: Gait normal.     Deep Tendon Reflexes: Reflexes are normal and symmetric.  Psychiatric:        Behavior: Behavior normal.        Thought Content: Thought content normal.        Judgment: Judgment normal.   LTM w/more redness/fluid   FTF>20 min discussing tinnitus   Lab Results  Component Value Date   WBC 9.8 05/06/2018   HGB 14.9 05/06/2018   HCT 43.6 05/06/2018   PLT 297.0 05/06/2018   GLUCOSE 91 05/06/2018   CHOL 194 07/15/2016   TRIG 81.0 07/15/2016   HDL 67.50 07/15/2016   LDLCALC 110 (H) 07/15/2016   ALT 29 05/06/2018   AST 28 05/06/2018   NA 138 05/06/2018   K 3.5 05/06/2018   CL 102 05/06/2018   CREATININE 1.08 05/06/2018   BUN 15 05/06/2018   CO2 28 05/06/2018   TSH 2.54 07/15/2016   HGBA1C 5.4 07/15/2016    Dg Facial Bones 1-2 Views  Result Date: 05/06/2018 CLINICAL DATA:  Facial swelling left side EXAM: FACIAL BONES - 1-2 VIEW COMPARISON:  None. FINDINGS: Frontal, Waters, Towne's, and lateral views obtained. There is no fracture or dislocation. No erosive change or bony destruction. There is opacification in the left maxillary antrum with  air-fluid level in this area. Other paranasal sinuses are clear. There is leftward deviation of the nasal septum. Mastoids are clear. IMPRESSION: Left maxillary sinusitis with air-fluid level in the left maxillary antrum. Deviated nasal septum toward the left. No blastic or lytic bone lesions. No fracture or dislocation. No obvious soft tissue swelling by radiography. Electronically Signed   By: Bretta Bang III M.D.   On: 05/06/2018 09:08    Assessment & Plan:   There are no diagnoses linked to this encounter.   No orders of the defined types were placed in this  encounter.    Follow-up: No follow-ups on file.  Walker Kehr, MD

## 2018-07-20 NOTE — Assessment & Plan Note (Signed)
Vit B complex

## 2018-08-02 ENCOUNTER — Ambulatory Visit: Payer: BC Managed Care – PPO | Admitting: Internal Medicine

## 2018-08-02 ENCOUNTER — Encounter: Payer: Self-pay | Admitting: Internal Medicine

## 2018-08-02 DIAGNOSIS — H9312 Tinnitus, left ear: Secondary | ICD-10-CM

## 2018-08-02 MED ORDER — GABAPENTIN 100 MG PO CAPS: ORAL_CAPSULE | ORAL | 3 refills | Status: DC

## 2018-08-02 MED ORDER — ESCITALOPRAM OXALATE 5 MG PO TABS: 5.0000 mg | ORAL_TABLET | Freq: Every day | ORAL | 5 refills | Status: DC

## 2018-08-02 NOTE — Assessment & Plan Note (Signed)
a hissing sound L ear - constant, not better. He saw Dr Jenne PaneBates - nl exam, nl hearing test; no need for a brain scan. He suggested anxiety rx. Nothing helped: steroids, abx, allergy Gabapentin helped to sleep... Start Lexapro

## 2018-08-02 NOTE — Progress Notes (Signed)
Subjective:  Patient ID: Donald Matthews, male    DOB: 08/10/1965  Age: 52 y.o. MRN: [id number]  CC: No chief complaint on file.   HPI Donald Matthews presents for a hissing sound L ear - constant, not better - may be 3% better.... He saw Dr Donald Matthews - nl exam, nl hearing test; no need for a brain scan. He suggested anxiety rx. Nothing helped: steroids, abx, allergy Gabapentin helped to sleep...  Outpatient Medications Prior to Visit  Medication Sig Dispense Refill  . amLODipine (NORVASC) 5 MG tablet Take 1 tablet (5 mg total) by mouth daily. 90 tablet 3  . b complex vitamins tablet Take 1 tablet by mouth daily. 100 tablet 3  . cetirizine (ZYRTEC ALLERGY) 10 MG tablet Take 1 tablet (10 mg total) by mouth daily. 100 tablet 3  . Cholecalciferol (VITAMIN D3) 2000 units capsule Take 1 capsule (2,000 Units total) by mouth daily. 100 capsule 3  . doxycycline (VIBRA-TABS) 100 MG tablet Take 1 tablet (100 mg total) by mouth 2 (two) times daily. 20 tablet 1  . gabapentin (NEURONTIN) 100 MG capsule Take 1-2 capsules (100-200 mg total) by mouth 3 (three) times daily as needed. For ear symptoms 120 capsule 3  . predniSONE (DELTASONE) 10 MG tablet Prednisone 10 mg: take 4 tabs a day x 3 days; then 3 tabs a day x 4 days; then 2 tabs a day x 4 days, then 1 tab a day x 6 days, then stop. Take pc. 38 tablet 0  . pseudoephedrine (SUDAFED) 120 MG 12 hr tablet Take 1 tablet (120 mg total) by mouth 2 (two) times daily as needed for congestion. 30 tablet 1  . sildenafil (VIAGRA) 100 MG tablet TAKE 1 TABLET BY MOUTH AS NEEDED FOR ERECTILE DYSFUNCTION. 6 tablet 0  . tacrolimus (PROTOPIC) 0.1 % ointment Apply topically 2 (two) times daily as needed. 100 g 1  . thiamine (VITAMIN B-1) 100 MG tablet Take 1 tablet (100 mg total) by mouth daily. 100 tablet 3  . triamcinolone ointment (KENALOG) 0.1 % APPLY TO AFFECTED AREA TWICE A DAY. 454 g 3   No facility-administered medications prior to visit.      ROS: Review of Systems  Constitutional: Negative for appetite change, fatigue and unexpected weight change.  HENT: Negative for congestion, nosebleeds, sneezing, sore throat and trouble swallowing.   Eyes: Negative for itching and visual disturbance.  Respiratory: Negative for cough.   Cardiovascular: Negative for chest pain, palpitations and leg swelling.  Gastrointestinal: Negative for abdominal distention, blood in stool, diarrhea and nausea.  Genitourinary: Negative for frequency and hematuria.  Musculoskeletal: Negative for back pain, gait problem, joint swelling and neck pain.  Skin: Negative for rash.  Neurological: Negative for dizziness, tremors, speech difficulty, weakness and light-headedness.  Psychiatric/Behavioral: Negative for agitation, dysphoric mood, sleep disturbance and suicidal ideas. The patient is nervous/anxious.   hissing in the ears is making him very tense at times...  Objective:  BP 126/82 (BP Location: Left Arm, Patient Position: Sitting, Cuff Size: Large)   Pulse (!) 102   Temp 98.5 F (36.9 C) (Oral)   Ht 5\' 9"  (1.753 m)   Wt 183 lb (83 kg)   SpO2 96%   BMI 27.02 kg/m   BP Readings from Last 3 Encounters:  08/02/18 126/82  07/20/18 124/78  07/13/18 128/82    Wt Readings from Last 3 Encounters:  08/02/18 183 lb (83 kg)  07/20/18 180 lb (81.6 kg)  07/13/18 181 lb (82.1  kg)    Physical Exam Constitutional:      General: He is not in acute distress.    Appearance: He is well-developed.     Comments: NAD  Eyes:     Conjunctiva/sclera: Conjunctivae normal.     Pupils: Pupils are equal, round, and reactive to light.  Neck:     Musculoskeletal: Normal range of motion.     Thyroid: No thyromegaly.     Vascular: No JVD.  Cardiovascular:     Rate and Rhythm: Normal rate and regular rhythm.     Heart sounds: Normal heart sounds. No murmur. No friction rub. No gallop.   Pulmonary:     Effort: Pulmonary effort is normal. No respiratory  distress.     Breath sounds: Normal breath sounds. No wheezing or rales.  Chest:     Chest wall: No tenderness.  Abdominal:     General: Bowel sounds are normal. There is no distension.     Palpations: Abdomen is soft. There is no mass.     Tenderness: There is no abdominal tenderness. There is no guarding or rebound.  Musculoskeletal: Normal range of motion.        General: No tenderness.  Lymphadenopathy:     Cervical: No cervical adenopathy.  Skin:    General: Skin is warm and dry.     Findings: No rash.  Neurological:     Mental Status: He is alert and oriented to person, place, and time.     Cranial Nerves: No cranial nerve deficit.     Motor: No abnormal muscle tone.     Coordination: Coordination normal.     Gait: Gait normal.     Deep Tendon Reflexes: Reflexes are normal and symmetric.  Psychiatric:        Behavior: Behavior normal.        Thought Content: Thought content normal.        Judgment: Judgment normal.     Lab Results  Component Value Date   WBC 9.8 05/06/2018   HGB 14.9 05/06/2018   HCT 43.6 05/06/2018   PLT 297.0 05/06/2018   GLUCOSE 91 05/06/2018   CHOL 194 07/15/2016   TRIG 81.0 07/15/2016   HDL 67.50 07/15/2016   LDLCALC 110 (H) 07/15/2016   ALT 29 05/06/2018   AST 28 05/06/2018   NA 138 05/06/2018   K 3.5 05/06/2018   CL 102 05/06/2018   CREATININE 1.08 05/06/2018   BUN 15 05/06/2018   CO2 28 05/06/2018   TSH 2.54 07/15/2016   HGBA1C 5.4 07/15/2016    Dg Facial Bones 1-2 Views  Result Date: 05/06/2018 CLINICAL DATA:  Facial swelling left side EXAM: FACIAL BONES - 1-2 VIEW COMPARISON:  None. FINDINGS: Frontal, Waters, Towne's, and lateral views obtained. There is no fracture or dislocation. No erosive change or bony destruction. There is opacification in the left maxillary antrum with air-fluid level in this area. Other paranasal sinuses are clear. There is leftward deviation of the nasal septum. Mastoids are clear. IMPRESSION: Left  maxillary sinusitis with air-fluid level in the left maxillary antrum. Deviated nasal septum toward the left. No blastic or lytic bone lesions. No fracture or dislocation. No obvious soft tissue swelling by radiography. Electronically Signed   By: Bretta BangWilliam  Woodruff III M.D.   On: 05/06/2018 09:08    Assessment & Plan:   There are no diagnoses linked to this encounter.   No orders of the defined types were placed in this encounter.    Follow-up:  No follow-ups on file.  Walker Kehr, MD

## 2018-11-16 IMAGING — US US ABDOMEN COMPLETE
1 series · 13 of 25 positions shown · non-contrast
Comparison: None.

CLINICAL DATA: Elevated liver function studies

EXAM:
ABDOMEN ULTRASOUND COMPLETE

[Series 1: us abdomen complete · 0.23mm/px · 13 of 93 slices shown]
[im 1/93]
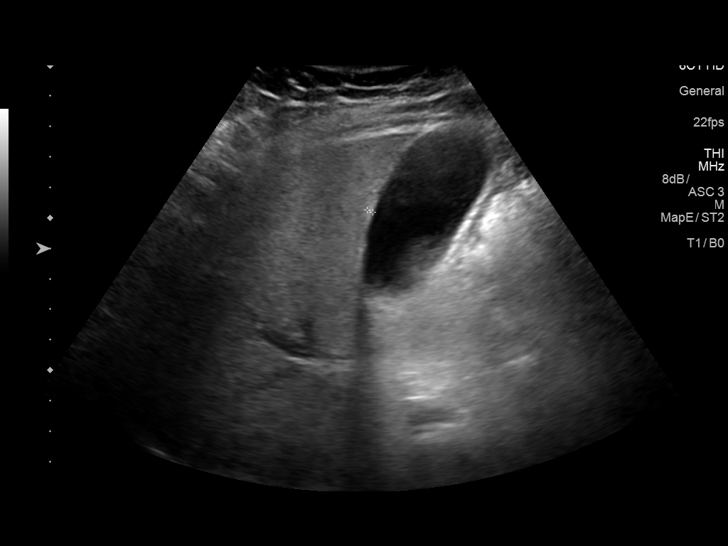
[im 8/93]
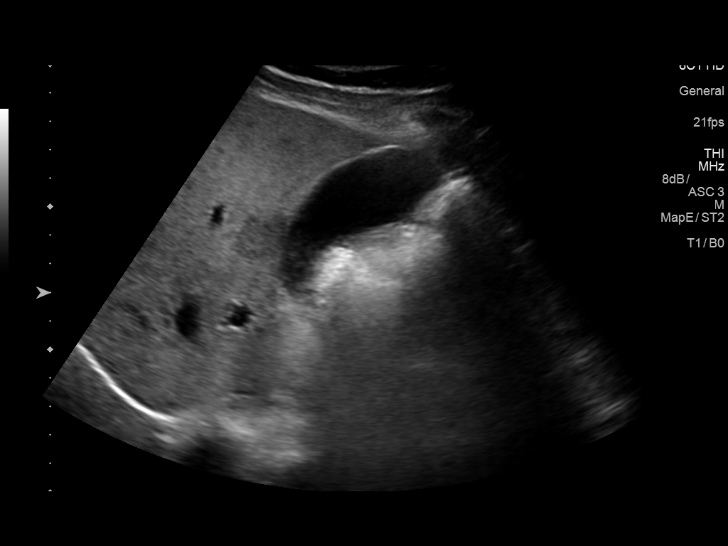
[im 16/93]
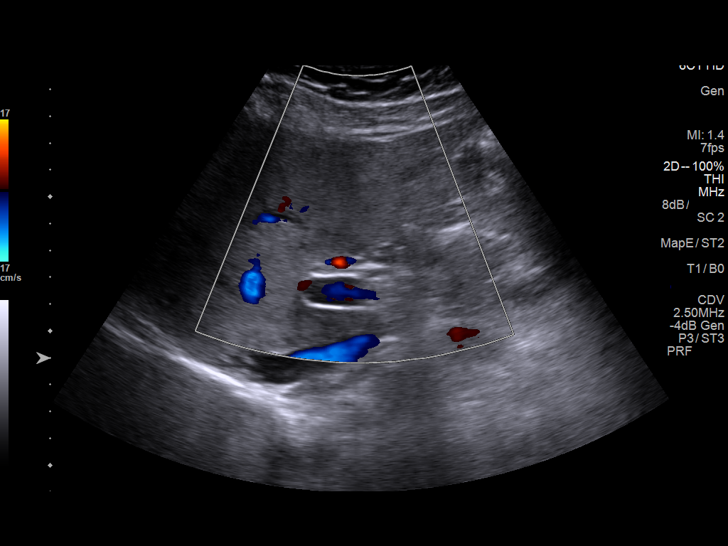
[im 24/93]
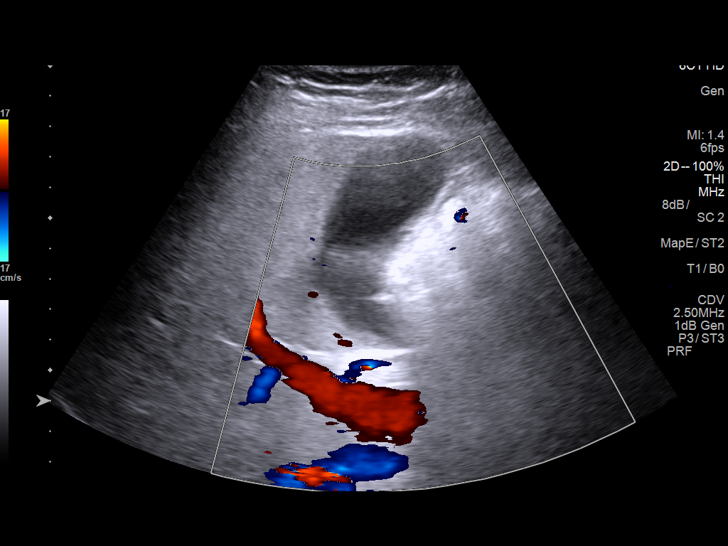
[im 31/93]
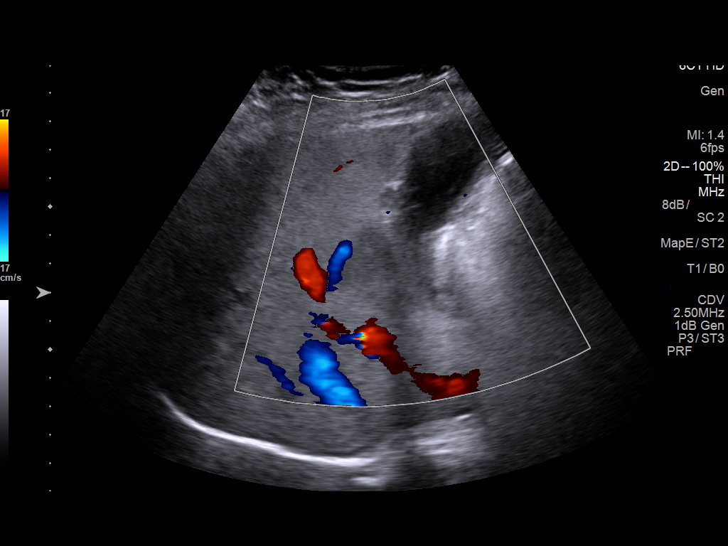
[im 39/93]
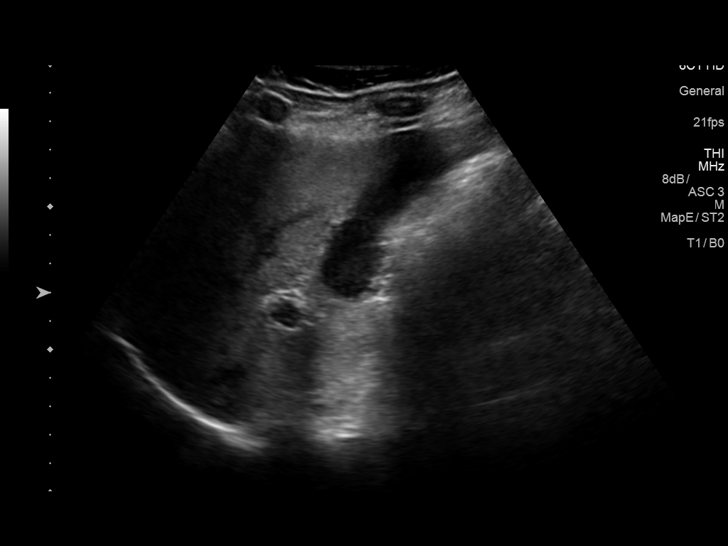
[im 47/93]
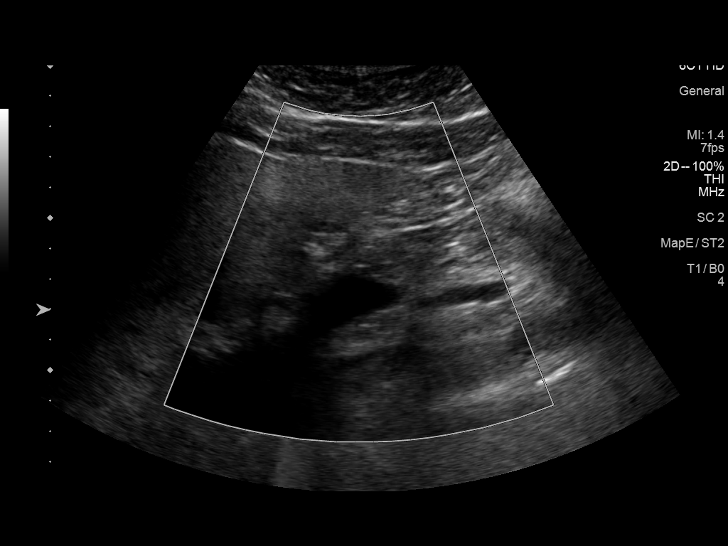
[im 54/93]
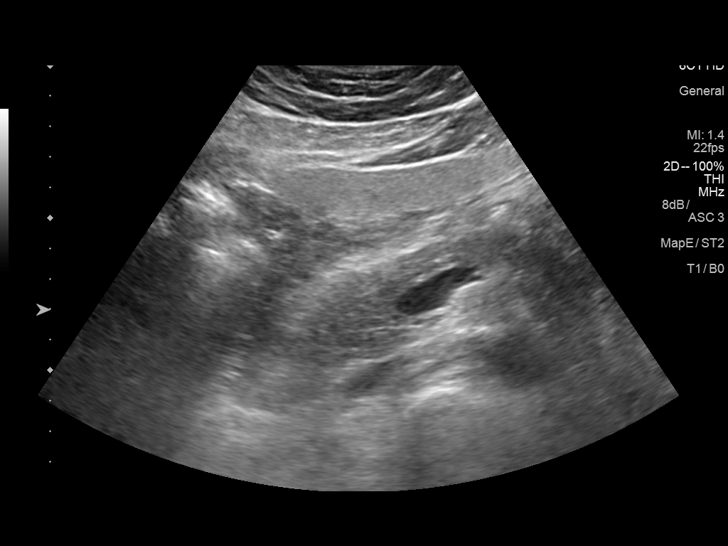
[im 62/93]
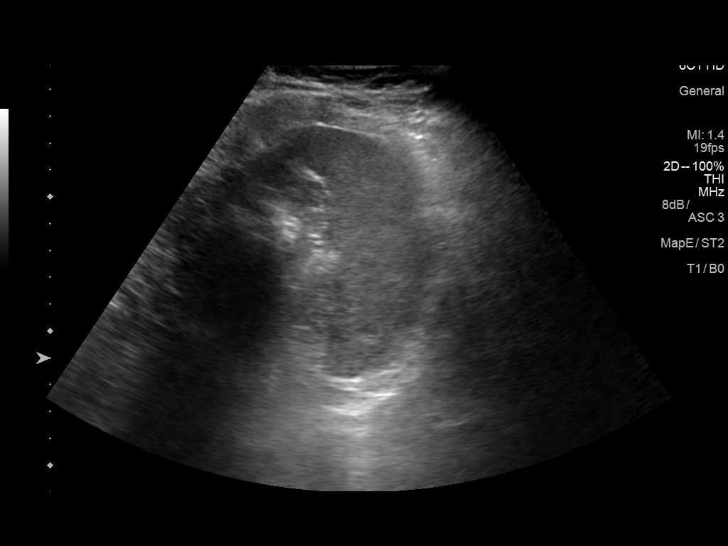
[im 70/93]
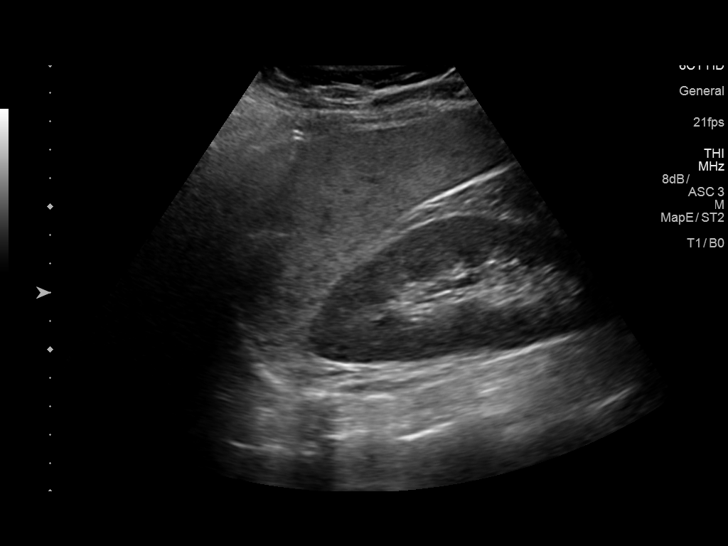
[im 77/93]
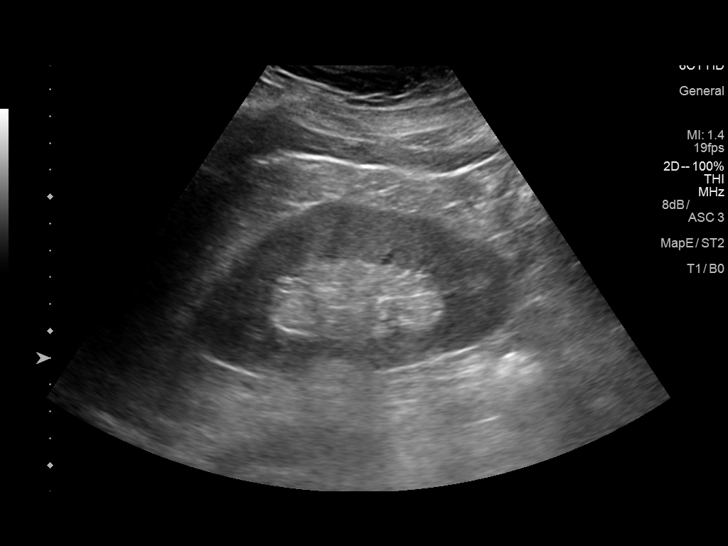
[im 85/93]
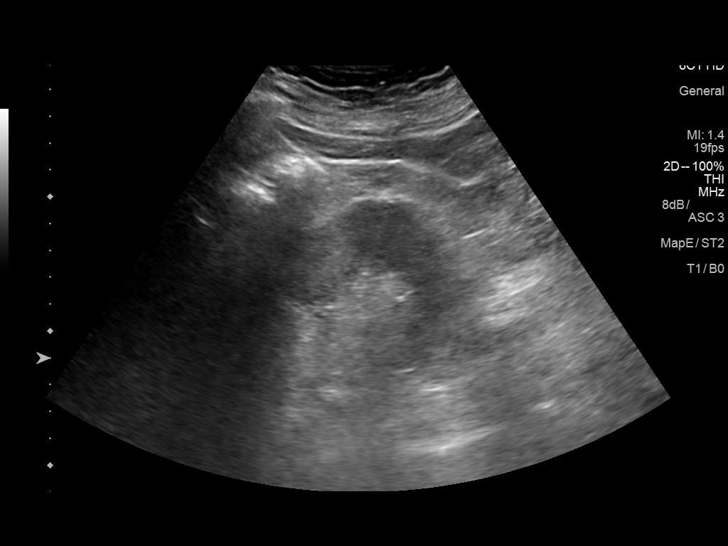
[im 93/93]
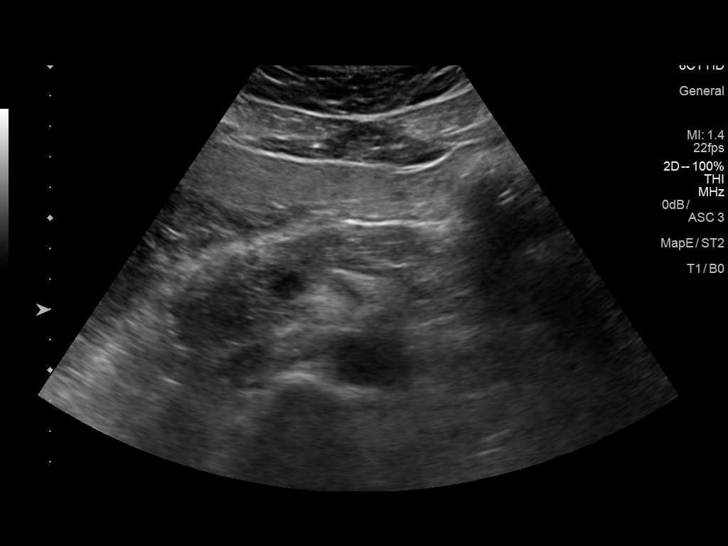

[13 of 25 positions shown; findings below may reference images not displayed]

FINDINGS: Gallbladder: No gallstones or wall thickening visualized. No
sonographic Murphy sign noted by sonographer.

Common bile duct: Diameter: 2.4 mm

Liver: The hepatic echotexture is mildly increased diffusely.
Adjacent to the gallbladder fossa there is a subtle area of
decreased echogenicity. There is no intrahepatic ductal dilation.
The surface contour remains smooth. Portal vein is patent on color
Doppler imaging with normal direction of blood flow towards the
liver.

IVC: No abnormality visualized.

Pancreas: Visualization of the pancreatic head and body is normal.
The pancreatic tail is obscured by bowel gas.

Spleen: Size and appearance within normal limits.

Right Kidney: Length: 12.1 cm. Echogenicity within normal limits. No
mass or hydronephrosis visualized.

Left Kidney: Length: 12.0 cm. Echogenicity within normal limits. No
mass or hydronephrosis visualized.

Abdominal aorta: Limited visualization of the distal aorta and
bifurcation due to bowel gas. The proximal and mid aorta exhibit no
evidence of aneurysm.

Other findings: None.
IMPRESSION: No gallstones or sonographic evidence of acute cholecystitis.

Increased hepatic echotexture most compatible with fatty
infiltrative change. Probable focal fatty sparing adjacent to the
gallbladder.

Normal appearance of the kidneys and IVC. Limited visualization of
the pancreatic tail and distal abdominal aorta due to bowel gas.

## 2019-01-04 ENCOUNTER — Other Ambulatory Visit: Payer: Self-pay | Admitting: Internal Medicine

## 2019-05-03 ENCOUNTER — Other Ambulatory Visit: Payer: Self-pay | Admitting: Internal Medicine

## 2019-06-06 ENCOUNTER — Other Ambulatory Visit: Payer: Self-pay | Admitting: Internal Medicine

## 2019-07-13 ENCOUNTER — Other Ambulatory Visit: Payer: Self-pay | Admitting: Internal Medicine

## 2019-10-10 ENCOUNTER — Other Ambulatory Visit: Payer: Self-pay

## 2019-10-10 ENCOUNTER — Ambulatory Visit: Payer: BC Managed Care – PPO | Admitting: Internal Medicine

## 2019-10-10 ENCOUNTER — Encounter: Payer: Self-pay | Admitting: Internal Medicine

## 2019-10-10 VITALS — BP 130/78 | HR 78 | Temp 98.3°F | Ht 69.0 in | Wt 183.0 lb

## 2019-10-10 DIAGNOSIS — Z0000 Encounter for general adult medical examination without abnormal findings: Secondary | ICD-10-CM | POA: Diagnosis not present

## 2019-10-10 DIAGNOSIS — M545 Low back pain, unspecified: Secondary | ICD-10-CM

## 2019-10-10 DIAGNOSIS — Z1211 Encounter for screening for malignant neoplasm of colon: Secondary | ICD-10-CM

## 2019-10-10 DIAGNOSIS — I10 Essential (primary) hypertension: Secondary | ICD-10-CM | POA: Diagnosis not present

## 2019-10-10 DIAGNOSIS — E519 Thiamine deficiency, unspecified: Secondary | ICD-10-CM

## 2019-10-10 DIAGNOSIS — E559 Vitamin D deficiency, unspecified: Secondary | ICD-10-CM | POA: Diagnosis not present

## 2019-10-10 LAB — CBC WITH DIFFERENTIAL/PLATELET
Basophils Absolute: 0 10*3/uL (ref 0.0–0.1)
Basophils Relative: 0.8 % (ref 0.0–3.0)
Eosinophils Absolute: 0.1 10*3/uL (ref 0.0–0.7)
Eosinophils Relative: 2.5 % (ref 0.0–5.0)
HCT: 42.9 % (ref 39.0–52.0)
Hemoglobin: 14.3 g/dL (ref 13.0–17.0)
Lymphocytes Relative: 29.2 % (ref 12.0–46.0)
Lymphs Abs: 1.2 10*3/uL (ref 0.7–4.0)
MCHC: 33.5 g/dL (ref 30.0–36.0)
MCV: 83.3 fl (ref 78.0–100.0)
Monocytes Absolute: 0.6 10*3/uL (ref 0.1–1.0)
Monocytes Relative: 14.1 % — ABNORMAL HIGH (ref 3.0–12.0)
Neutro Abs: 2.2 10*3/uL (ref 1.4–7.7)
Neutrophils Relative %: 53.4 % (ref 43.0–77.0)
Platelets: 212 10*3/uL (ref 150.0–400.0)
RBC: 5.15 Mil/uL (ref 4.22–5.81)
RDW: 13.2 % (ref 11.5–15.5)
WBC: 4.1 10*3/uL (ref 4.0–10.5)

## 2019-10-10 LAB — URINALYSIS
Bilirubin Urine: NEGATIVE
Hgb urine dipstick: NEGATIVE
Ketones, ur: NEGATIVE
Leukocytes,Ua: NEGATIVE
Nitrite: NEGATIVE
Specific Gravity, Urine: 1.02 (ref 1.000–1.030)
Total Protein, Urine: NEGATIVE
Urine Glucose: NEGATIVE
Urobilinogen, UA: 0.2 (ref 0.0–1.0)
pH: 6 (ref 5.0–8.0)

## 2019-10-10 LAB — BASIC METABOLIC PANEL
BUN: 10 mg/dL (ref 6–23)
CO2: 28 mEq/L (ref 19–32)
Calcium: 9.2 mg/dL (ref 8.4–10.5)
Chloride: 105 mEq/L (ref 96–112)
Creatinine, Ser: 0.85 mg/dL (ref 0.40–1.50)
GFR: 114.18 mL/min (ref >60.00)
Glucose, Bld: 95 mg/dL (ref 70–99)
Potassium: 4.5 mEq/L (ref 3.5–5.1)
Sodium: 139 mEq/L (ref 135–145)

## 2019-10-10 LAB — TSH: TSH: 1.42 u[IU]/mL (ref 0.35–4.50)

## 2019-10-10 LAB — LIPID PANEL
Cholesterol: 194 mg/dL (ref 0–200)
HDL: 61.9 mg/dL (ref >39.00)
LDL Cholesterol: 119 mg/dL — ABNORMAL HIGH (ref 0–99)
NonHDL: 132.2
Total CHOL/HDL Ratio: 3
Triglycerides: 66 mg/dL (ref 0.0–149.0)
VLDL: 13.2 mg/dL (ref 0.0–40.0)

## 2019-10-10 LAB — HEPATIC FUNCTION PANEL
ALT: 34 U/L (ref 0–53)
AST: 30 U/L (ref 0–37)
Albumin: 4.2 g/dL (ref 3.5–5.2)
Alkaline Phosphatase: 87 U/L (ref 39–117)
Bilirubin, Direct: 0.1 mg/dL (ref 0.0–0.3)
Total Bilirubin: 0.5 mg/dL (ref 0.2–1.2)
Total Protein: 7 g/dL (ref 6.0–8.3)

## 2019-10-10 LAB — PSA: PSA: 0.62 ng/mL (ref 0.10–4.00)

## 2019-10-10 MED ORDER — TRIAMCINOLONE ACETONIDE 0.1 % EX OINT: TOPICAL_OINTMENT | CUTANEOUS | 3 refills | Status: DC

## 2019-10-10 MED ORDER — AMLODIPINE BESYLATE 5 MG PO TABS: 5.0000 mg | ORAL_TABLET | Freq: Every day | ORAL | 3 refills | Status: DC

## 2019-10-10 MED ORDER — GABAPENTIN 100 MG PO CAPS: ORAL_CAPSULE | ORAL | 5 refills | Status: DC

## 2019-10-10 MED ORDER — FAMCICLOVIR 500 MG PO TABS: 500.0000 mg | ORAL_TABLET | Freq: Two times a day (BID) | ORAL | 3 refills | Status: DC

## 2019-10-10 NOTE — Assessment & Plan Note (Signed)
Vit B complex

## 2019-10-10 NOTE — Assessment & Plan Note (Signed)
Vit D 

## 2019-10-10 NOTE — Assessment & Plan Note (Signed)
Norvasc

## 2019-10-10 NOTE — Assessment & Plan Note (Signed)
Ibuprofen prn 

## 2019-10-10 NOTE — Progress Notes (Signed)
Subjective:  Patient ID: Donald Matthews, male    DOB: [birth year]  Age: 54 y.o. MRN: [id number]  CC: No chief complaint on file.   HPI Donald Matthews presents for eczema, HTN, tinnitus Well exam  Outpatient Medications Prior to Visit  Medication Sig Dispense Refill  . b complex vitamins tablet Take 1 tablet by mouth daily. 100 tablet 3  . Cholecalciferol (VITAMIN D3) 2000 units capsule Take 1 capsule (2,000 Units total) by mouth daily. 100 capsule 3  . escitalopram (LEXAPRO) 5 MG tablet Take 1 tablet (5 mg total) by mouth daily. 30 tablet 5  . sildenafil (VIAGRA) 100 MG tablet TAKE 1 TABLET BY MOUTH AS NEEDED FOR ERECTILE DYSFUNCTION. 6 tablet 0  . tacrolimus (PROTOPIC) 0.1 % ointment Apply topically 2 (two) times daily as needed. 100 g 1  . thiamine (VITAMIN B-1) 100 MG tablet Take 1 tablet (100 mg total) by mouth daily. 100 tablet 3  . amLODipine (NORVASC) 5 MG tablet TAKE 1 TABLET ONCE DAILY. 90 tablet 3  . famciclovir (FAMVIR) 500 MG tablet Take 1 tablet (500 mg total) by mouth 2 (two) times daily. **OFFICE VISIT IS DUE FOR FUTURE REFILLS** 20 tablet 0  . gabapentin (NEURONTIN) 100 MG capsule TAKE 1 CAPSULE IN THE MORNING AND 3 CAPSULES AT NIGHT FOR EAR SYMPTOMS. 120 capsule 5  . triamcinolone ointment (KENALOG) 0.1 % APPLY TO AFFECTED AREA TWICE A DAY. 454 g 3  . cetirizine (ZYRTEC ALLERGY) 10 MG tablet Take 1 tablet (10 mg total) by mouth daily. 100 tablet 3  . doxycycline (VIBRA-TABS) 100 MG tablet Take 1 tablet (100 mg total) by mouth 2 (two) times daily. 20 tablet 1   No facility-administered medications prior to visit.    ROS: Review of Systems  Constitutional: Negative for appetite change, fatigue and unexpected weight change.  HENT: Negative for congestion, nosebleeds, sneezing, sore throat and trouble swallowing.   Eyes: Negative for itching and visual disturbance.  Respiratory: Negative for cough.   Cardiovascular: Negative for chest pain, palpitations and leg  swelling.  Gastrointestinal: Negative for abdominal distention, blood in stool, diarrhea and nausea.  Genitourinary: Negative for frequency and hematuria.  Musculoskeletal: Negative for back pain, gait problem, joint swelling and neck pain.  Skin: Negative for rash.  Neurological: Negative for dizziness, tremors, speech difficulty and weakness.  Psychiatric/Behavioral: Negative for agitation, dysphoric mood, sleep disturbance and suicidal ideas. The patient is not nervous/anxious.     Objective:  BP 130/78 (BP Location: Left Arm, Patient Position: Sitting, Cuff Size: Large)   Pulse 78   Temp 98.3 F (36.8 C) (Oral)   Ht 5\' 9"  (1.753 m)   Wt 183 lb (83 kg)   SpO2 97%   BMI 27.02 kg/m   BP Readings from Last 3 Encounters:  10/10/19 130/78  08/02/18 126/82  07/20/18 124/78    Wt Readings from Last 3 Encounters:  10/10/19 183 lb (83 kg)  08/02/18 183 lb (83 kg)  07/20/18 180 lb (81.6 kg)    Physical Exam Constitutional:      General: He is not in acute distress.    Appearance: He is well-developed.     Comments: NAD  Eyes:     Conjunctiva/sclera: Conjunctivae normal.     Pupils: Pupils are equal, round, and reactive to light.  Neck:     Thyroid: No thyromegaly.     Vascular: No JVD.  Cardiovascular:     Rate and Rhythm: Normal rate and regular rhythm.  Heart sounds: Normal heart sounds. No murmur. No friction rub. No gallop.   Pulmonary:     Effort: Pulmonary effort is normal. No respiratory distress.     Breath sounds: Normal breath sounds. No wheezing or rales.  Chest:     Chest wall: No tenderness.  Abdominal:     General: Bowel sounds are normal. There is no distension.     Palpations: Abdomen is soft. There is no mass.     Tenderness: There is no abdominal tenderness. There is no guarding or rebound.  Musculoskeletal:        General: No tenderness. Normal range of motion.     Cervical back: Normal range of motion.  Lymphadenopathy:     Cervical: No  cervical adenopathy.  Skin:    General: Skin is warm and dry.     Findings: No rash.  Neurological:     Mental Status: He is alert and oriented to person, place, and time.     Cranial Nerves: No cranial nerve deficit.     Motor: No abnormal muscle tone.     Coordination: Coordination normal.     Gait: Gait normal.     Deep Tendon Reflexes: Reflexes are normal and symmetric.  Psychiatric:        Behavior: Behavior normal.        Thought Content: Thought content normal.        Judgment: Judgment normal.      Rectal per GI  Lab Results  Component Value Date   WBC 9.8 05/06/2018   HGB 14.9 05/06/2018   HCT 43.6 05/06/2018   PLT 297.0 05/06/2018   GLUCOSE 91 05/06/2018   CHOL 194 07/15/2016   TRIG 81.0 07/15/2016   HDL 67.50 07/15/2016   LDLCALC 110 (H) 07/15/2016   ALT 29 05/06/2018   AST 28 05/06/2018   NA 138 05/06/2018   K 3.5 05/06/2018   CL 102 05/06/2018   CREATININE 1.08 05/06/2018   BUN 15 05/06/2018   CO2 28 05/06/2018   TSH 2.54 07/15/2016   HGBA1C 5.4 07/15/2016    DG Facial Bones 1-2 Views  Result Date: 05/06/2018 CLINICAL DATA:  Facial swelling left side EXAM: FACIAL BONES - 1-2 VIEW COMPARISON:  None. FINDINGS: Frontal, Waters, Towne's, and lateral views obtained. There is no fracture or dislocation. No erosive change or bony destruction. There is opacification in the left maxillary antrum with air-fluid level in this area. Other paranasal sinuses are clear. There is leftward deviation of the nasal septum. Mastoids are clear. IMPRESSION: Left maxillary sinusitis with air-fluid level in the left maxillary antrum. Deviated nasal septum toward the left. No blastic or lytic bone lesions. No fracture or dislocation. No obvious soft tissue swelling by radiography. Electronically Signed   By: Bretta Bang III M.D.   On: 05/06/2018 09:08    Assessment & Plan:   There are no diagnoses linked to this encounter.   Meds ordered this encounter  Medications  .  famciclovir (FAMVIR) 500 MG tablet    Sig: Take 1 tablet (500 mg total) by mouth 2 (two) times daily.    Dispense:  180 tablet    Refill:  3  . amLODipine (NORVASC) 5 MG tablet    Sig: Take 1 tablet (5 mg total) by mouth daily.    Dispense:  90 tablet    Refill:  3  . gabapentin (NEURONTIN) 100 MG capsule    Sig: TAKE 1 CAPSULE IN THE MORNING AND 3 CAPSULES AT NIGHT  FOR EAR SYMPTOMS.    Dispense:  120 capsule    Refill:  5  . triamcinolone ointment (KENALOG) 0.1 %    Sig: APPLY TO AFFECTED AREA TWICE A DAY.    Dispense:  454 g    Refill:  3     Follow-up: No follow-ups on file.  Sonda Primes, MD

## 2019-10-11 DIAGNOSIS — Z0000 Encounter for general adult medical examination without abnormal findings: Secondary | ICD-10-CM | POA: Insufficient documentation

## 2019-10-11 NOTE — Assessment & Plan Note (Addendum)
We discussed a need for adhering to healthy diet and exercise. Colon ref

## 2019-10-11 NOTE — Addendum Note (Signed)
Addended by: Tresa Garter on: 10/11/2019 02:23 PM   Modules accepted: Level of Service

## 2019-11-30 ENCOUNTER — Encounter: Payer: Self-pay | Admitting: Internal Medicine

## 2021-01-01 ENCOUNTER — Other Ambulatory Visit: Payer: Self-pay | Admitting: Internal Medicine

## 2021-01-15 ENCOUNTER — Other Ambulatory Visit: Payer: Self-pay

## 2021-01-15 ENCOUNTER — Ambulatory Visit: Payer: BC Managed Care – PPO | Admitting: Internal Medicine

## 2021-01-15 ENCOUNTER — Encounter: Payer: Self-pay | Admitting: Internal Medicine

## 2021-01-15 VITALS — BP 126/90 | HR 87 | Temp 98.1°F | Ht 69.0 in | Wt 174.0 lb

## 2021-01-15 DIAGNOSIS — Z0000 Encounter for general adult medical examination without abnormal findings: Secondary | ICD-10-CM | POA: Diagnosis not present

## 2021-01-15 DIAGNOSIS — E559 Vitamin D deficiency, unspecified: Secondary | ICD-10-CM | POA: Diagnosis not present

## 2021-01-15 DIAGNOSIS — Z1211 Encounter for screening for malignant neoplasm of colon: Secondary | ICD-10-CM | POA: Diagnosis not present

## 2021-01-15 DIAGNOSIS — Z23 Encounter for immunization: Secondary | ICD-10-CM | POA: Diagnosis not present

## 2021-01-15 DIAGNOSIS — I10 Essential (primary) hypertension: Secondary | ICD-10-CM

## 2021-01-15 LAB — LIPID PANEL
Cholesterol: 160 mg/dL (ref 0–200)
HDL: 51.6 mg/dL (ref >39.00)
LDL Cholesterol: 78 mg/dL (ref 0–99)
NonHDL: 107.99
Total CHOL/HDL Ratio: 3
Triglycerides: 150 mg/dL — ABNORMAL HIGH (ref 0.0–149.0)
VLDL: 30 mg/dL (ref 0.0–40.0)

## 2021-01-15 LAB — CBC WITH DIFFERENTIAL/PLATELET
Basophils Absolute: 0 10*3/uL (ref 0.0–0.1)
Basophils Relative: 1 % (ref 0.0–3.0)
Eosinophils Absolute: 0.1 10*3/uL (ref 0.0–0.7)
Eosinophils Relative: 1.6 % (ref 0.0–5.0)
HCT: 43.6 % (ref 39.0–52.0)
Hemoglobin: 14.8 g/dL (ref 13.0–17.0)
Lymphocytes Relative: 41.6 % (ref 12.0–46.0)
Lymphs Abs: 1.4 10*3/uL (ref 0.7–4.0)
MCHC: 33.8 g/dL (ref 30.0–36.0)
MCV: 84.1 fl (ref 78.0–100.0)
Monocytes Absolute: 0.4 10*3/uL (ref 0.1–1.0)
Monocytes Relative: 12.3 % — ABNORMAL HIGH (ref 3.0–12.0)
Neutro Abs: 1.4 10*3/uL (ref 1.4–7.7)
Neutrophils Relative %: 43.5 % (ref 43.0–77.0)
Platelets: 287 10*3/uL (ref 150.0–400.0)
RBC: 5.19 Mil/uL (ref 4.22–5.81)
RDW: 13 % (ref 11.5–15.5)
WBC: 3.3 10*3/uL — ABNORMAL LOW (ref 4.0–10.5)

## 2021-01-15 LAB — COMPREHENSIVE METABOLIC PANEL
ALT: 33 U/L (ref 0–53)
AST: 24 U/L (ref 0–37)
Albumin: 4.3 g/dL (ref 3.5–5.2)
Alkaline Phosphatase: 71 U/L (ref 39–117)
BUN: 8 mg/dL (ref 6–23)
CO2: 27 mEq/L (ref 19–32)
Calcium: 8.9 mg/dL (ref 8.4–10.5)
Chloride: 107 mEq/L (ref 96–112)
Creatinine, Ser: 0.84 mg/dL (ref 0.40–1.50)
GFR: 99.17 mL/min (ref >60.00)
Glucose, Bld: 82 mg/dL (ref 70–99)
Potassium: 4.5 mEq/L (ref 3.5–5.1)
Sodium: 143 mEq/L (ref 135–145)
Total Bilirubin: 0.5 mg/dL (ref 0.2–1.2)
Total Protein: 7 g/dL (ref 6.0–8.3)

## 2021-01-15 LAB — URINALYSIS
Bilirubin Urine: NEGATIVE
Hgb urine dipstick: NEGATIVE
Ketones, ur: NEGATIVE
Leukocytes,Ua: NEGATIVE
Nitrite: NEGATIVE
Specific Gravity, Urine: 1.015 (ref 1.000–1.030)
Total Protein, Urine: NEGATIVE
Urine Glucose: NEGATIVE
Urobilinogen, UA: 1 (ref 0.0–1.0)
pH: 7 (ref 5.0–8.0)

## 2021-01-15 LAB — TSH: TSH: 1.19 u[IU]/mL (ref 0.35–4.50)

## 2021-01-15 LAB — PSA: PSA: 0.48 ng/mL (ref 0.10–4.00)

## 2021-01-15 MED ORDER — TRIAMCINOLONE ACETONIDE 0.1 % EX OINT: TOPICAL_OINTMENT | CUTANEOUS | 3 refills | Status: DC

## 2021-01-15 MED ORDER — AMLODIPINE BESYLATE 5 MG PO TABS: 5.0000 mg | ORAL_TABLET | Freq: Every day | ORAL | 3 refills | Status: DC

## 2021-01-15 MED ORDER — FAMCICLOVIR 500 MG PO TABS: 500.0000 mg | ORAL_TABLET | Freq: Two times a day (BID) | ORAL | 3 refills | Status: DC

## 2021-01-15 MED ORDER — GABAPENTIN 100 MG PO CAPS: ORAL_CAPSULE | ORAL | 5 refills | Status: DC

## 2021-01-15 NOTE — Assessment & Plan Note (Signed)
On Vit D 

## 2021-01-15 NOTE — Assessment & Plan Note (Signed)
Cont w/Amlodipine 

## 2021-01-15 NOTE — Progress Notes (Signed)
Subjective:  Patient ID: Donald Matthews, male    DOB: June 06, 1966  Age: 55 y.o. MRN: [id number]  CC: Medication Refill (Need refills on all meds)   HPI Donald Matthews presents for a well exam  Outpatient Medications Prior to Visit  Medication Sig Dispense Refill   b complex vitamins tablet Take 1 tablet by mouth daily. 100 tablet 3   Cholecalciferol (VITAMIN D3) 2000 units capsule Take 1 capsule (2,000 Units total) by mouth daily. 100 capsule 3   sildenafil (VIAGRA) 100 MG tablet TAKE 1 TABLET BY MOUTH AS NEEDED FOR ERECTILE DYSFUNCTION. 6 tablet 0   tacrolimus (PROTOPIC) 0.1 % ointment Apply topically 2 (two) times daily as needed. 100 g 1   thiamine (VITAMIN B-1) 100 MG tablet Take 1 tablet (100 mg total) by mouth daily. 100 tablet 3   amLODipine (NORVASC) 5 MG tablet Take 1 tablet (5 mg total) by mouth daily. 90 tablet 3   famciclovir (FAMVIR) 500 MG tablet Take 1 tablet (500 mg total) by mouth 2 (two) times daily. 180 tablet 3   gabapentin (NEURONTIN) 100 MG capsule TAKE 1 CAPSULE IN THE MORNING AND 3 CAPSULES AT NIGHT FOR EAR SYMPTOMS. 120 capsule 5   triamcinolone ointment (KENALOG) 0.1 % APPLY TO AFFECTED AREA TWICE A DAY. 454 g 3   cetirizine (ZYRTEC ALLERGY) 10 MG tablet Take 1 tablet (10 mg total) by mouth daily. 100 tablet 3   No facility-administered medications prior to visit.    ROS: Review of Systems  Constitutional:  Negative for appetite change, fatigue and unexpected weight change.  HENT:  Negative for congestion, nosebleeds, sneezing, sore throat and trouble swallowing.   Eyes:  Negative for itching and visual disturbance.  Respiratory:  Negative for cough.   Cardiovascular:  Negative for chest pain, palpitations and leg swelling.  Gastrointestinal:  Negative for abdominal distention, blood in stool, diarrhea and nausea.  Genitourinary:  Negative for frequency and hematuria.  Musculoskeletal:  Negative for back pain, gait problem, joint swelling and neck  pain.  Skin:  Negative for rash.  Neurological:  Negative for dizziness, tremors, speech difficulty and weakness.  Psychiatric/Behavioral:  Negative for agitation, dysphoric mood and sleep disturbance. The patient is not nervous/anxious.    Objective:  BP 126/90 (BP Location: Left Arm)   Pulse 87   Temp 98.1 F (36.7 C) (Oral)   Ht 5\' 9"  (1.753 m)   Wt 174 lb (78.9 kg)   SpO2 95%   BMI 25.70 kg/m   BP Readings from Last 3 Encounters:  01/15/21 126/90  10/10/19 130/78  08/02/18 126/82    Wt Readings from Last 3 Encounters:  01/15/21 174 lb (78.9 kg)  10/10/19 183 lb (83 kg)  08/02/18 183 lb (83 kg)    Physical Exam Constitutional:      General: He is not in acute distress.    Appearance: He is well-developed.     Comments: NAD  Eyes:     Conjunctiva/sclera: Conjunctivae normal.     Pupils: Pupils are equal, round, and reactive to light.  Neck:     Thyroid: No thyromegaly.     Vascular: No JVD.  Cardiovascular:     Rate and Rhythm: Normal rate and regular rhythm.     Heart sounds: Normal heart sounds. No murmur heard.   No friction rub. No gallop.  Pulmonary:     Effort: Pulmonary effort is normal. No respiratory distress.     Breath sounds: Normal breath sounds. No wheezing  or rales.  Chest:     Chest wall: No tenderness.  Abdominal:     General: Bowel sounds are normal. There is no distension.     Palpations: Abdomen is soft. There is no mass.     Tenderness: There is no abdominal tenderness. There is no guarding or rebound.  Musculoskeletal:        General: No tenderness. Normal range of motion.     Cervical back: Normal range of motion.  Lymphadenopathy:     Cervical: No cervical adenopathy.  Skin:    General: Skin is warm and dry.     Findings: No rash.  Neurological:     Mental Status: He is alert and oriented to person, place, and time.     Cranial Nerves: No cranial nerve deficit.     Motor: No abnormal muscle tone.     Coordination: Coordination  normal.     Gait: Gait normal.     Deep Tendon Reflexes: Reflexes are normal and symmetric.  Psychiatric:        Behavior: Behavior normal.        Thought Content: Thought content normal.        Judgment: Judgment normal.  Rectal - per GI  Lab Results  Component Value Date   WBC 4.1 10/10/2019   HGB 14.3 10/10/2019   HCT 42.9 10/10/2019   PLT 212.0 10/10/2019   GLUCOSE 95 10/10/2019   CHOL 194 10/10/2019   TRIG 66.0 10/10/2019   HDL 61.90 10/10/2019   LDLCALC 119 (H) 10/10/2019   ALT 34 10/10/2019   AST 30 10/10/2019   NA 139 10/10/2019   K 4.5 10/10/2019   CL 105 10/10/2019   CREATININE 0.85 10/10/2019   BUN 10 10/10/2019   CO2 28 10/10/2019   TSH 1.42 10/10/2019   PSA 0.62 10/10/2019   HGBA1C 5.4 07/15/2016    DG Facial Bones 1-2 Views  Result Date: 05/06/2018 CLINICAL DATA:  Facial swelling left side EXAM: FACIAL BONES - 1-2 VIEW COMPARISON:  None. FINDINGS: Frontal, Waters, Towne's, and lateral views obtained. There is no fracture or dislocation. No erosive change or bony destruction. There is opacification in the left maxillary antrum with air-fluid level in this area. Other paranasal sinuses are clear. There is leftward deviation of the nasal septum. Mastoids are clear. IMPRESSION: Left maxillary sinusitis with air-fluid level in the left maxillary antrum. Deviated nasal septum toward the left. No blastic or lytic bone lesions. No fracture or dislocation. No obvious soft tissue swelling by radiography. Electronically Signed   By: Bretta Bang III M.D.   On: 05/06/2018 09:08    Assessment & Plan:   There are no diagnoses linked to this encounter.   Meds ordered this encounter  Medications   amLODipine (NORVASC) 5 MG tablet    Sig: Take 1 tablet (5 mg total) by mouth daily.    Dispense:  90 tablet    Refill:  3   famciclovir (FAMVIR) 500 MG tablet    Sig: Take 1 tablet (500 mg total) by mouth 2 (two) times daily.    Dispense:  180 tablet    Refill:  3    gabapentin (NEURONTIN) 100 MG capsule    Sig: TAKE 1 CAPSULE IN THE MORNING AND 3 CAPSULES AT NIGHT FOR EAR SYMPTOMS.    Dispense:  120 capsule    Refill:  5   triamcinolone ointment (KENALOG) 0.1 %    Sig: APPLY TO AFFECTED AREA TWICE A DAY.  Dispense:  454 g    Refill:  3     Follow-up: No follow-ups on file.  Sonda Primes, MD

## 2021-01-15 NOTE — Patient Instructions (Signed)

## 2021-01-15 NOTE — Assessment & Plan Note (Addendum)
  We discussed age appropriate health related issues, including available/recomended screening tests and vaccinations. Labs were ordered to be later reviewed . All questions were answered. We discussed one or more of the following - seat belt use, use of sunscreen/sun exposure exercise, safe sex, fall risk reduction, second hand smoke exposure, firearm use and storage, seat belt use, a need for adhering to healthy diet and exercise. Labs were ordered.  All questions were answered. Coronary calcium CT test was offered

## 2021-08-19 ENCOUNTER — Encounter: Payer: Self-pay | Admitting: Internal Medicine

## 2021-10-25 ENCOUNTER — Other Ambulatory Visit: Payer: Self-pay

## 2021-10-25 ENCOUNTER — Ambulatory Visit (INDEPENDENT_AMBULATORY_CARE_PROVIDER_SITE_OTHER): Payer: BC Managed Care – PPO | Admitting: Family Medicine

## 2021-10-25 ENCOUNTER — Encounter: Payer: Self-pay | Admitting: Family Medicine

## 2021-10-25 VITALS — BP 136/90 | HR 82 | Temp 98.2°F | Ht 69.0 in | Wt 165.4 lb

## 2021-10-25 DIAGNOSIS — I10 Essential (primary) hypertension: Secondary | ICD-10-CM

## 2021-10-25 NOTE — Patient Instructions (Signed)
?Get a blood pressure monitor.  ?Keep an eye on your BP at home.  ? ?Goal BP is <140/80  ? ? ? ?DASH Eating Plan ?DASH stands for Dietary Approaches to Stop Hypertension. The DASH eating plan is a healthy eating plan that has been shown to: ?Reduce high blood pressure (hypertension). ?Reduce your risk for type 2 diabetes, heart disease, and stroke. ?Help with weight loss. ?What are tips for following this plan? ?Reading food labels ?Check food labels for the amount of salt (sodium) per serving. Choose foods with less than 5 percent of the Daily Value of sodium. Generally, foods with less than 300 milligrams (mg) of sodium per serving fit into this eating plan. ?To find whole grains, look for the word "whole" as the first word in the ingredient list. ?Shopping ?Buy products labeled as "low-sodium" or "no salt added." ?Buy fresh foods. Avoid canned foods and pre-made or frozen meals. ?Cooking ?Avoid adding salt when cooking. Use salt-free seasonings or herbs instead of table salt or sea salt. Check with your health care provider or pharmacist before using salt substitutes. ?Do not fry foods. Cook foods using healthy methods such as baking, boiling, grilling, roasting, and broiling instead. ?Cook with heart-healthy oils, such as olive, canola, avocado, soybean, or sunflower oil. ?Meal planning ? ?Eat a balanced diet that includes: ?4 or more servings of fruits and 4 or more servings of vegetables each day. Try to fill one-half of your plate with fruits and vegetables. ?6-8 servings of whole grains each day. ?Less than 6 oz (170 g) of lean meat, poultry, or fish each day. A 3-oz (85-g) serving of meat is about the same size as a deck of cards. One egg equals 1 oz (28 g). ?2-3 servings of low-fat dairy each day. One serving is 1 cup (237 mL). ?1 serving of nuts, seeds, or beans 5 times each week. ?2-3 servings of heart-healthy fats. Healthy fats called omega-3 fatty acids are found in foods such as walnuts, flaxseeds,  fortified milks, and eggs. These fats are also found in cold-water fish, such as sardines, salmon, and mackerel. ?Limit how much you eat of: ?Canned or prepackaged foods. ?Food that is high in trans fat, such as some fried foods. ?Food that is high in saturated fat, such as fatty meat. ?Desserts and other sweets, sugary drinks, and other foods with added sugar. ?Full-fat dairy products. ?Do not salt foods before eating. ?Do not eat more than 4 egg yolks a week. ?Try to eat at least 2 vegetarian meals a week. ?Eat more home-cooked food and less restaurant, buffet, and fast food. ?Lifestyle ?When eating at a restaurant, ask that your food be prepared with less salt or no salt, if possible. ?If you drink alcohol: ?Limit how much you use to: ?0-1 drink a day for women who are not pregnant. ?0-2 drinks a day for men. ?Be aware of how much alcohol is in your drink. In the [place] one drink equals one 12 oz bottle of beer (355 mL), one 5 oz glass of wine (148 mL), or one 1? oz glass of hard liquor (44 mL). ?General information ?Avoid eating more than 2,300 mg of salt a day. If you have hypertension, you may need to reduce your sodium intake to 1,500 mg a day. ?Work with your health care provider to maintain a healthy body weight or to lose weight. Ask what an ideal weight is for you. ?Get at least 30 minutes of exercise that causes your heart to beat  faster (aerobic exercise) most days of the week. Activities may include walking, swimming, or biking. ?Work with your health care provider or dietitian to adjust your eating plan to your individual calorie needs. ?What foods should I eat? ?Fruits ?All fresh, dried, or frozen fruit. Canned fruit in natural juice (without added sugar). ?Vegetables ?Fresh or frozen vegetables (raw, steamed, roasted, or grilled). Low-sodium or reduced-sodium tomato and vegetable juice. Low-sodium or reduced-sodium tomato sauce and tomato paste. Low-sodium or reduced-sodium canned  vegetables. ?Grains ?Whole-grain or whole-wheat bread. Whole-grain or whole-wheat pasta. Brown rice. Orpah Cobbatmeal. Quinoa. Bulgur. Whole-grain and low-sodium cereals. Pita bread. Low-fat, low-sodium crackers. Whole-wheat flour tortillas. ?Meats and other proteins ?Skinless chicken or Malawiturkey. Ground chicken or Mal[place]urkey. Pork with fat trimmed off. Fish and seafood. Egg whites. Dried beans, peas, or lentils. Unsalted nuts, nut butters, and seeds. Unsalted canned beans. Lean cuts of beef with fat trimmed off. Low-sodium, lean precooked or cured meat, such as sausages or meat loaves. ?Dairy ?Low-fat (1%) or fat-free (skim) milk. Reduced-fat, low-fat, or fat-free cheeses. Nonfat, low-sodium ricotta or cottage cheese. Low-fat or nonfat yogurt. Low-fat, low-sodium cheese. ?Fats and oils ?Soft margarine without trans fats. Vegetable oil. Reduced-fat, low-fat, or light mayonnaise and salad dressings (reduced-sodium). Canola, safflower, olive, avocado, soybean, and sunflower oils. Avocado. ?Seasonings and condiments ?Herbs. Spices. Seasoning mixes without salt. ?Other foods ?Unsalted popcorn and pretzels. Fat-free sweets. ?The items listed above may not be a complete list of foods and beverages you can eat. Contact a dietitian for more information. ?What foods should I avoid? ?Fruits ?Canned fruit in a light or heavy syrup. Fried fruit. Fruit in cream or butter sauce. ?Vegetables ?Creamed or fried vegetables. Vegetables in a cheese sauce. Regular canned vegetables (not low-sodium or reduced-sodium). Regular canned tomato sauce and paste (not low-sodium or reduced-sodium). Regular tomato and vegetable juice (not low-sodium or reduced-sodium). Rosita FirePickles. Olives. ?Grains ?Baked goods made with fat, such as croissants, muffins, or some breads. Dry pasta or rice meal packs. ?Meats and other proteins ?Fatty cuts of meat. Ribs. Fried meat. Tomasa BlaseBacon. Bologna, salami, and other precooked or cured meats, such as sausages or meat loaves. Fat from  the back of a pig (fatback). Bratwurst. Salted nuts and seeds. Canned beans with added salt. Canned or smoked fish. Whole eggs or egg yolks. Chicken or [place]urkey with skin. ?Dairy ?Whole or 2% milk, cream, and half-and-half. Whole or full-fat cream cheese. Whole-fat or sweetened yogurt. Full-fat cheese. Nondairy creamers. Whipped toppings. Processed cheese and cheese spreads. ?Fats and oils ?Butter. Stick margarine. Lard. Shortening. Ghee. Bacon fat. Tropical oils, such as coconut, palm kernel, or palm oil. ?Seasonings and condiments ?Onion salt, garlic salt, seasoned salt, table salt, and sea salt. Worcestershire sauce. Tartar sauce. Barbecue sauce. Teriyaki sauce. Soy sauce, including reduced-sodium. Steak sauce. Canned and packaged gravies. Fish sauce. Oyster sauce. Cocktail sauce. Store-bought horseradish. Ketchup. Mustard. Meat flavorings and tenderizers. Bouillon cubes. Hot sauces. Pre-made or packaged marinades. Pre-made or packaged taco seasonings. Relishes. Regular salad dressings. ?Other foods ?Salted popcorn and pretzels. ?The items listed above may not be a complete list of foods and beverages you should avoid. Contact a dietitian for more information. ?Where to find more information ?National Heart, Lung, and Blood Institute: PopSteam.iswww.nhlbi.nih.gov ?American Heart Association: www.heart.org ?Academy of Nutrition and Dietetics: www.eatright.org ?National Kidney Foundation: www.kidney.org ?Summary ?The DASH eating plan is a healthy eating plan that has been shown to reduce high blood pressure (hypertension). It may also reduce your risk for type 2 diabetes, heart disease, and stroke. ?When on  the DASH eating plan, aim to eat more fresh fruits and vegetables, whole grains, lean proteins, low-fat dairy, and heart-healthy fats. ?With the DASH eating plan, you should limit salt (sodium) intake to 2,300 mg a day. If you have hypertension, you may need to reduce your sodium intake to 1,500 mg a day. ?Work with your  health care provider or dietitian to adjust your eating plan to your individual calorie needs. ?This information is not intended to replace advice given to you by your health care provider. Make sure you discuss any questions

## 2021-10-25 NOTE — Progress Notes (Signed)
? ?  Subjective:  ? ? Patient ID: Donald Matthews, male    DOB: 1965/09/25, 56 y.o.   MRN: [id number] ? ?HPI ?Chief Complaint  ?Patient presents with  ? Blood Pressure Check  ?  Pt went out of town and didn't take his BP meds for about a week, now that he is back to taking them he would like to make sure BP is okay.  ? ?He is here for a blood pressure check.  States he went out of town and forgot to take his medications with him.  He does not check his blood pressure at home and does not have a monitor to do so. ?States normally he takes amlodipine 5 mg daily without any concerns. ? ?States he had a headache that resolved with BC powder. Feels at baseline now.  ? ?Denies fever, chills, dizziness, chest pain, palpitations, shortness of breath, abdominal pain, N/V/D, urinary symptoms, LE edema.  ? ? ?Reviewed allergies, medications, past medical, surgical, family, and social history. ? ? ?Review of Systems ?Pertinent positives and negatives in the history of present illness. ? ?   ?Objective:  ? Physical Exam ?BP 136/90 (BP Location: Left Arm, Patient Position: Sitting, Cuff Size: Large)   Pulse 82   Temp 98.2 ?F (36.8 ?C) (Oral)   Ht 5\' 9"  (1.753 m)   Wt 165 lb 6.4 oz (75 kg)   SpO2 98%   BMI 24.43 kg/m?  ? ?Alert and oriented and in no acute distress.  Respirations unlabored. ? ? ?   ?Assessment & Plan:  ?Essential hypertension ? ?Discussed that his blood pressure is not quite to goal today but is close.  I suspect his blood pressure will continue to improve now that he is back on his medication.  Continue amlodipine 5 mg daily.  We also discussed low-sodium diet, DASH handout was provided. ?He may pick up a blood pressure monitor and start monitoring his blood pressure at home.  Discussed goal blood pressure readings.  Follow-up with his PCP as scheduled. ? ?

## 2022-11-12 ENCOUNTER — Ambulatory Visit: Payer: BC Managed Care – PPO | Admitting: Internal Medicine

## 2022-11-12 ENCOUNTER — Encounter: Payer: Self-pay | Admitting: Internal Medicine

## 2022-11-12 VITALS — BP 132/86 | HR 87 | Temp 98.7°F | Ht 69.0 in | Wt 168.0 lb

## 2022-11-12 DIAGNOSIS — L309 Dermatitis, unspecified: Secondary | ICD-10-CM | POA: Diagnosis not present

## 2022-11-12 DIAGNOSIS — I10 Essential (primary) hypertension: Secondary | ICD-10-CM | POA: Diagnosis not present

## 2022-11-12 DIAGNOSIS — R2230 Localized swelling, mass and lump, unspecified upper limb: Secondary | ICD-10-CM

## 2022-11-12 DIAGNOSIS — Z0000 Encounter for general adult medical examination without abnormal findings: Secondary | ICD-10-CM

## 2022-11-12 LAB — CBC WITH DIFFERENTIAL/PLATELET
Basophils Absolute: 0.1 10*3/uL (ref 0.0–0.1)
Basophils Relative: 1.3 % (ref 0.0–3.0)
Eosinophils Absolute: 0.2 10*3/uL (ref 0.0–0.7)
Eosinophils Relative: 4.1 % (ref 0.0–5.0)
HCT: 42.1 % (ref 39.0–52.0)
Hemoglobin: 14 g/dL (ref 13.0–17.0)
Lymphocytes Relative: 28.4 % (ref 12.0–46.0)
Lymphs Abs: 1.5 10*3/uL (ref 0.7–4.0)
MCHC: 33.3 g/dL (ref 30.0–36.0)
MCV: 84.3 fl (ref 78.0–100.0)
Monocytes Absolute: 0.4 10*3/uL (ref 0.1–1.0)
Monocytes Relative: 7.8 % (ref 3.0–12.0)
Neutro Abs: 3.1 10*3/uL (ref 1.4–7.7)
Neutrophils Relative %: 58.4 % (ref 43.0–77.0)
Platelets: 273 10*3/uL (ref 150.0–400.0)
RBC: 4.99 Mil/uL (ref 4.22–5.81)
RDW: 13.7 % (ref 11.5–15.5)
WBC: 5.3 10*3/uL (ref 4.0–10.5)

## 2022-11-12 LAB — URINALYSIS
Bilirubin Urine: NEGATIVE
Hgb urine dipstick: NEGATIVE
Leukocytes,Ua: NEGATIVE
Nitrite: NEGATIVE
Specific Gravity, Urine: 1.01 (ref 1.000–1.030)
Total Protein, Urine: NEGATIVE
Urine Glucose: NEGATIVE
Urobilinogen, UA: 2 — AB (ref 0.0–1.0)
pH: 7 (ref 5.0–8.0)

## 2022-11-12 LAB — COMPREHENSIVE METABOLIC PANEL
ALT: 14 U/L (ref 0–53)
AST: 18 U/L (ref 0–37)
Albumin: 4.6 g/dL (ref 3.5–5.2)
Alkaline Phosphatase: 64 U/L (ref 39–117)
BUN: 14 mg/dL (ref 6–23)
CO2: 29 mEq/L (ref 19–32)
Calcium: 9.3 mg/dL (ref 8.4–10.5)
Chloride: 104 mEq/L (ref 96–112)
Creatinine, Ser: 0.83 mg/dL (ref 0.40–1.50)
GFR: 98.27 mL/min (ref >60.00)
Glucose, Bld: 83 mg/dL (ref 70–99)
Potassium: 4.5 mEq/L (ref 3.5–5.1)
Sodium: 141 mEq/L (ref 135–145)
Total Bilirubin: 0.6 mg/dL (ref 0.2–1.2)
Total Protein: 7.1 g/dL (ref 6.0–8.3)

## 2022-11-12 LAB — LIPID PANEL
Cholesterol: 178 mg/dL (ref 0–200)
HDL: 62.6 mg/dL (ref >39.00)
LDL Cholesterol: 94 mg/dL (ref 0–99)
NonHDL: 115.38
Total CHOL/HDL Ratio: 3
Triglycerides: 105 mg/dL (ref 0.0–149.0)
VLDL: 21 mg/dL (ref 0.0–40.0)

## 2022-11-12 LAB — TSH: TSH: 0.82 u[IU]/mL (ref 0.35–5.50)

## 2022-11-12 LAB — PSA: PSA: 0.52 ng/mL (ref 0.10–4.00)

## 2022-11-12 MED ORDER — AMLODIPINE BESYLATE 5 MG PO TABS: 5.0000 mg | ORAL_TABLET | Freq: Every day | ORAL | 3 refills | Status: DC

## 2022-11-12 MED ORDER — FAMCICLOVIR 500 MG PO TABS: 500.0000 mg | ORAL_TABLET | Freq: Two times a day (BID) | ORAL | 3 refills | Status: AC

## 2022-11-12 MED ORDER — TRIAMCINOLONE ACETONIDE 0.1 % EX OINT: TOPICAL_OINTMENT | CUTANEOUS | 3 refills | Status: DC

## 2022-11-12 MED ORDER — GABAPENTIN 100 MG PO CAPS: ORAL_CAPSULE | ORAL | 1 refills | Status: DC

## 2022-11-12 NOTE — Assessment & Plan Note (Signed)
New L post shoulder mass, oval and NT 6x4 cm- ?lipoma [place] ordered

## 2022-11-12 NOTE — Progress Notes (Signed)
Subjective:  Patient ID: Donald Matthews, male    DOB: 01-19-1966  Age: 57 y.o. MRN: [id number]  CC: Medication Refill (Insect bite on back of left arm/shoulder)   HPI Donald Matthews presents for a growth on the L post shoulder  F/u tinnitus, rash, HTN   Outpatient Medications Prior to Visit  Medication Sig Dispense Refill   b complex vitamins tablet Take 1 tablet by mouth daily. 100 tablet 3   Cholecalciferol (VITAMIN D3) 2000 units capsule Take 1 capsule (2,000 Units total) by mouth daily. 100 capsule 3   sildenafil (VIAGRA) 100 MG tablet TAKE 1 TABLET BY MOUTH AS NEEDED FOR ERECTILE DYSFUNCTION. 6 tablet 0   tacrolimus (PROTOPIC) 0.1 % ointment Apply topically 2 (two) times daily as needed. 100 g 1   thiamine (VITAMIN B-1) 100 MG tablet Take 1 tablet (100 mg total) by mouth daily. 100 tablet 3   amLODipine (NORVASC) 5 MG tablet Take 1 tablet (5 mg total) by mouth daily. 90 tablet 3   famciclovir (FAMVIR) 500 MG tablet Take 1 tablet (500 mg total) by mouth 2 (two) times daily. 180 tablet 3   gabapentin (NEURONTIN) 100 MG capsule TAKE 1 CAPSULE IN THE MORNING AND 3 CAPSULES AT NIGHT FOR EAR SYMPTOMS. 120 capsule 5   triamcinolone ointment (KENALOG) 0.1 % APPLY TO AFFECTED AREA TWICE A DAY. 454 g 3   No facility-administered medications prior to visit.    ROS: Review of Systems  Constitutional:  Negative for appetite change, fatigue and unexpected weight change.  HENT:  Positive for tinnitus. Negative for congestion, nosebleeds, sneezing, sore throat and trouble swallowing.   Eyes:  Negative for itching and visual disturbance.  Respiratory:  Negative for cough.   Cardiovascular:  Negative for chest pain, palpitations and leg swelling.  Gastrointestinal:  Negative for abdominal distention, blood in stool, diarrhea and nausea.  Genitourinary:  Negative for frequency and hematuria.  Musculoskeletal:  Negative for back pain, gait problem, joint swelling and neck pain.  Skin:   Positive for rash.  Neurological:  Negative for dizziness, tremors, speech difficulty and weakness.  Psychiatric/Behavioral:  Negative for agitation, dysphoric mood and sleep disturbance. The patient is not nervous/anxious.     Objective:  BP 132/86 (BP Location: Left Arm, Patient Position: Sitting, Cuff Size: Large)   Pulse 87   Temp 98.7 F (37.1 C) (Oral)   Ht 5\' 9"  (1.753 m)   Wt 168 lb (76.2 kg)   SpO2 96%   BMI 24.81 kg/m   BP Readings from Last 3 Encounters:  11/13/22 (!) 154/104  11/12/22 132/86  10/25/21 136/90    Wt Readings from Last 3 Encounters:  11/13/22 165 lb (74.8 kg)  11/12/22 168 lb (76.2 kg)  10/25/21 165 lb 6.4 oz (75 kg)    Physical Exam Constitutional:      General: He is not in acute distress.    Appearance: He is well-developed.     Comments: NAD  Eyes:     Conjunctiva/sclera: Conjunctivae normal.     Pupils: Pupils are equal, round, and reactive to light.  Neck:     Thyroid: No thyromegaly.     Vascular: No JVD.  Cardiovascular:     Rate and Rhythm: Normal rate and regular rhythm.     Heart sounds: Normal heart sounds. No murmur heard.    No friction rub. No gallop.  Pulmonary:     Effort: Pulmonary effort is normal. No respiratory distress.     Breath  sounds: Normal breath sounds. No wheezing or rales.  Chest:     Chest wall: No tenderness.  Abdominal:     General: Bowel sounds are normal. There is no distension.     Palpations: Abdomen is soft. There is no mass.     Tenderness: There is no abdominal tenderness. There is no guarding or rebound.  Musculoskeletal:        General: Swelling present. No tenderness. Normal range of motion.     Cervical back: Normal range of motion.  Lymphadenopathy:     Cervical: No cervical adenopathy.  Skin:    General: Skin is warm and dry.     Findings: No rash.  Neurological:     Mental Status: He is alert and oriented to person, place, and time.     Cranial Nerves: No cranial nerve deficit.      Motor: No abnormal muscle tone.     Coordination: Coordination normal.     Gait: Gait normal.     Deep Tendon Reflexes: Reflexes are normal and symmetric.  Psychiatric:        Behavior: Behavior normal.        Thought Content: Thought content normal.        Judgment: Judgment normal.    L post shoulder mass, oval and NT 6x4 cm- ?lipoma  Lab Results  Component Value Date   WBC 5.3 11/12/2022   HGB 14.0 11/12/2022   HCT 42.1 11/12/2022   PLT 273.0 11/12/2022   GLUCOSE 83 11/12/2022   CHOL 178 11/12/2022   TRIG 105.0 11/12/2022   HDL 62.60 11/12/2022   LDLCALC 94 11/12/2022   ALT 14 11/12/2022   AST 18 11/12/2022   NA 141 11/12/2022   K 4.5 11/12/2022   CL 104 11/12/2022   CREATININE 0.83 11/12/2022   BUN 14 11/12/2022   CO2 29 11/12/2022   TSH 0.82 11/12/2022   PSA 0.52 11/12/2022   HGBA1C 5.4 07/15/2016    DG Facial Bones 1-2 Views  Result Date: 05/06/2018 CLINICAL DATA:  Facial swelling left side EXAM: FACIAL BONES - 1-2 VIEW COMPARISON:  None. FINDINGS: Frontal, Waters, Towne's, and lateral views obtained. There is no fracture or dislocation. No erosive change or bony destruction. There is opacification in the left maxillary antrum with air-fluid level in this area. Other paranasal sinuses are clear. There is leftward deviation of the nasal septum. Mastoids are clear. IMPRESSION: Left maxillary sinusitis with air-fluid level in the left maxillary antrum. Deviated nasal septum toward the left. No blastic or lytic bone lesions. No fracture or dislocation. No obvious soft tissue swelling by radiography. Electronically Signed   By: Bretta Bang III M.D.   On: 05/06/2018 09:08    Assessment & Plan:   Problem List Items Addressed This Visit     Essential hypertension    Continue with amlodipine      Relevant Medications   amLODipine (NORVASC) 5 MG tablet   Chronic eczema    Continue with Kenalog ointment as needed      Well adult exam   Relevant Orders   TSH  (Completed)   Urinalysis (Completed)   CBC with Differential/Platelet (Completed)   Lipid panel (Completed)   PSA (Completed)   Comprehensive metabolic panel (Completed)   Shoulder mass - Primary    New L post shoulder mass, oval and NT 6x4 cm- ?lipoma [place] ordered      Relevant Orders   Ambulatory referral to Sports Medicine      Meds  ordered this encounter  Medications   amLODipine (NORVASC) 5 MG tablet    Sig: Take 1 tablet (5 mg total) by mouth daily.    Dispense:  90 tablet    Refill:  3   gabapentin (NEURONTIN) 100 MG capsule    Sig: TAKE 1 CAPSULE IN THE MORNING AND 3 CAPSULES AT NIGHT FOR EAR SYMPTOMS.    Dispense:  120 capsule    Refill:  1   famciclovir (FAMVIR) 500 MG tablet    Sig: Take 1 tablet (500 mg total) by mouth 2 (two) times daily.    Dispense:  180 tablet    Refill:  3   triamcinolone ointment (KENALOG) 0.1 %    Sig: APPLY TO AFFECTED AREA TWICE A DAY.    Dispense:  454 g    Refill:  3      Follow-up: Return in about 6 weeks (around 12/24/2022) for Wellness Exam.  Sonda Primes, MD

## 2022-11-13 ENCOUNTER — Ambulatory Visit: Payer: BC Managed Care – PPO | Admitting: Family Medicine

## 2022-11-13 ENCOUNTER — Ambulatory Visit (INDEPENDENT_AMBULATORY_CARE_PROVIDER_SITE_OTHER): Payer: BC Managed Care – PPO

## 2022-11-13 ENCOUNTER — Other Ambulatory Visit: Payer: Self-pay

## 2022-11-13 VITALS — BP 154/104 | HR 89 | Ht 69.0 in | Wt 165.0 lb

## 2022-11-13 DIAGNOSIS — R2230 Localized swelling, mass and lump, unspecified upper limb: Secondary | ICD-10-CM

## 2022-11-13 DIAGNOSIS — R2232 Localized swelling, mass and lump, left upper limb: Secondary | ICD-10-CM | POA: Diagnosis not present

## 2022-11-13 NOTE — Patient Instructions (Signed)
Thank you for coming in today.   Please get an Xray today before you leave   I think this is a lipoma.  Lets keep an eye on it.  If its growing or bothers you we can do more.   Lipoma  A lipoma is a noncancerous (benign) tumor that is made up of fat cells. This is a very common type of soft-tissue growth. Lipomas are usually found under the skin (subcutaneous). They may occur in any tissue of the body that contains fat. Common areas for lipomas to appear include the back, arms, shoulders, buttocks, and thighs. Lipomas grow slowly, and they are usually painless. Most lipomas do not cause problems and do not require treatment. What are the causes? The cause of this condition is not known. What increases the risk? You are more likely to develop this condition if: You are 49-32 years old. You have a family history of lipomas. What are the signs or symptoms? A lipoma usually appears as a small, round bump under the skin. In most cases, the lump will: Feel soft or rubbery. Not cause pain or other symptoms. However, if a lipoma is located in an area where it pushes on nerves, it can become painful or cause other symptoms. How is this diagnosed? A lipoma can usually be diagnosed with a physical exam. You may also have tests to confirm the diagnosis and to rule out other conditions. Tests may include: Imaging tests, such as a CT scan or an MRI. Removal of a tissue sample to be looked at under a microscope (biopsy). How is this treated? Treatment for this condition depends on the size of the lipoma and whether it is causing any symptoms. For small lipomas that are not causing problems, no treatment is needed. If a lipoma is bigger or it causes problems, surgery may be done to remove the lipoma. Lipomas can also be removed to improve appearance. Most often, the procedure is done after applying a medicine that numbs the area (local anesthetic). Liposuction may be done to reduce the size of the  lipoma before it is removed through surgery, or it may be done to remove the lipoma. Lipomas are removed with this method to limit incision size and scarring. A liposuction tube is inserted through a small incision into the lipoma, and the contents of the lipoma are removed through the tube with suction. Follow these instructions at home: Watch your lipoma for any changes. Keep all follow-up visits. This is important. Where to find more information OrthoInfo: orthoinfo.aaos.org Contact a health care provider if: Your lipoma becomes larger or hard. Your lipoma becomes painful, red, or increasingly swollen. These could be signs of infection or a more serious condition. Get help right away if: You develop tingling or numbness in an area near the lipoma. This could indicate that the lipoma is causing nerve damage. Summary A lipoma is a noncancerous tumor that is made up of fat cells. Most lipomas do not cause problems and do not require treatment. If a lipoma is bigger or it causes problems, surgery may be done to remove the lipoma. Contact a health care provider if your lipoma becomes larger or hard, or if it becomes painful, red, or increasingly swollen. These could be signs of infection or a more serious condition. This information is not intended to replace advice given to you by your health care provider. Make sure you discuss any questions you have with your health care provider. Document Revised: 08/09/2021 Document Reviewed: 08/09/2021  Elsevier Patient Education  2023 Elsevier Inc.  

## 2022-11-13 NOTE — Progress Notes (Signed)
   Rubin Payor, PhD, LAT, ATC acting as a scribe for Donald Graham, MD.   Subjective:    CC: L shoulder mass  HPI: Pt is a 57 y/o male presenting w/ a mass on his L shoulder that new noticed about 2 weeks ago. Pt locates area to the posterior aspect of his L shoulder. No pain. Pt and PCP were concerned and want the bump to be further evaluated. Pt is R-hand dominate.  Pertinent review of Systems: No fevers or chills  Relevant historical information: Hypertension   Objective:    Vitals:   11/13/22 1326 11/13/22 1332  BP: (!) 154/104 (!) 154/104  Pulse: 89   SpO2: 96%    General: Well Developed, well nourished, and in no acute distress.   MSK: Left posterior shoulder small visible subcutaneous masses present.  Nontender.  Normal shoulder motion and strength.  Lab and Radiology Results  Diagnostic Limited MSK Ultrasound of: Mass left posterior shoulder Subcutaneous mass visualized on ultrasound.  Appears consistent with surrounding fatty tissue but encapsulated.  Measures 3.6 x 1.8 cm. No increased vascular activity on Doppler. 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  No calcifications associated.  No aggressive appearing bony lesions are present. Await formal radiology review    Impression and Recommendations:    Assessment and Plan: 57 y.o. male with left posterior shoulder mass concerning for lipoma.  No aggressive appearing characteristic features on x-ray or ultrasound.  Plan for watchful waiting for now.  If worsening or growing or becoming bothersome next step would likely be MRI.Donald Matthews  PDMP not reviewed this encounter. Orders Placed This Encounter  Procedures   [place] LIMITED JOINT SPACE STRUCTURES UP LEFT(NO LINKED CHARGES)    Order Specific Question:   Reason for Exam (SYMPTOM  OR DIAGNOSIS REQUIRED)    Answer:   left shoulder mass    Order  Specific Question:   Preferred imaging location?    Answer:   [facility] Shoulder Left    Standing Status:   Future    Number of Occurrences:   1    Standing Expiration Date:   11/13/2023    Order Specific Question:   Reason for Exam (SYMPTOM  OR DIAGNOSIS REQUIRED)    Answer:   eval poss lipoma.    Order Specific Question:   Preferred imaging location?    Answer:   Donald Matthews   No orders of the defined types were placed in this encounter.   Discussed warning signs or symptoms. Please see discharge instructions. Patient expresses understanding.   The above documentation has been reviewed and is accurate and complete Donald Matthews, M.D.

## 2022-11-17 NOTE — Progress Notes (Signed)
Left shoulder x-ray looks normal to radiology.  They cannot see the mass on x-ray.

## 2022-12-06 NOTE — Assessment & Plan Note (Signed)
Continue with Kenalog ointment as needed

## 2022-12-06 NOTE — Assessment & Plan Note (Signed)
Continue with amlodipine 

## 2024-02-19 ENCOUNTER — Other Ambulatory Visit: Payer: Self-pay | Admitting: Internal Medicine

## 2024-07-20 ENCOUNTER — Encounter: Payer: Self-pay | Admitting: Internal Medicine

## 2024-07-20 ENCOUNTER — Ambulatory Visit: Payer: Self-pay | Admitting: Internal Medicine

## 2024-07-20 VITALS — BP 158/102 | HR 88 | Temp 97.9°F | Resp 16 | Ht 69.0 in | Wt 162.6 lb

## 2024-07-20 DIAGNOSIS — E785 Hyperlipidemia, unspecified: Secondary | ICD-10-CM

## 2024-07-20 DIAGNOSIS — I10 Essential (primary) hypertension: Secondary | ICD-10-CM

## 2024-07-20 DIAGNOSIS — Z1211 Encounter for screening for malignant neoplasm of colon: Secondary | ICD-10-CM | POA: Insufficient documentation

## 2024-07-20 DIAGNOSIS — L2084 Intrinsic (allergic) eczema: Secondary | ICD-10-CM | POA: Diagnosis not present

## 2024-07-20 DIAGNOSIS — Z114 Encounter for screening for human immunodeficiency virus [HIV]: Secondary | ICD-10-CM

## 2024-07-20 DIAGNOSIS — Z1159 Encounter for screening for other viral diseases: Secondary | ICD-10-CM | POA: Insufficient documentation

## 2024-07-20 DIAGNOSIS — L509 Urticaria, unspecified: Secondary | ICD-10-CM

## 2024-07-20 DIAGNOSIS — Z23 Encounter for immunization: Secondary | ICD-10-CM

## 2024-07-20 DIAGNOSIS — Z0000 Encounter for general adult medical examination without abnormal findings: Secondary | ICD-10-CM

## 2024-07-20 DIAGNOSIS — Z0001 Encounter for general adult medical examination with abnormal findings: Secondary | ICD-10-CM

## 2024-07-20 LAB — URINALYSIS, ROUTINE W REFLEX MICROSCOPIC
Bilirubin Urine: NEGATIVE
Hgb urine dipstick: NEGATIVE
Ketones, ur: NEGATIVE
Leukocytes,Ua: NEGATIVE
Nitrite: NEGATIVE
RBC / HPF: NONE SEEN (ref 0–?)
Specific Gravity, Urine: 1.015 (ref 1.000–1.030)
Total Protein, Urine: NEGATIVE
Urine Glucose: NEGATIVE
Urobilinogen, UA: 0.2 (ref 0.0–1.0)
WBC, UA: NONE SEEN (ref 0–?)
pH: 6 (ref 5.0–8.0)

## 2024-07-20 LAB — LIPID PANEL
Cholesterol: 164 mg/dL (ref 28–200)
HDL: 66.4 mg/dL (ref >39.00)
LDL Cholesterol: 84 mg/dL (ref 10–99)
NonHDL: 97.72
Total CHOL/HDL Ratio: 2
Triglycerides: 69 mg/dL (ref 10.0–149.0)
VLDL: 13.8 mg/dL (ref 0.0–40.0)

## 2024-07-20 LAB — CBC WITH DIFFERENTIAL/PLATELET
Basophils Absolute: 0 K/uL (ref 0.0–0.1)
Basophils Relative: 1.2 % (ref 0.0–3.0)
Eosinophils Absolute: 0.1 K/uL (ref 0.0–0.7)
Eosinophils Relative: 3.3 % (ref 0.0–5.0)
HCT: 42.2 % (ref 39.0–52.0)
Hemoglobin: 14 g/dL (ref 13.0–17.0)
Lymphocytes Relative: 31.7 % (ref 12.0–46.0)
Lymphs Abs: 1.1 K/uL (ref 0.7–4.0)
MCHC: 33 g/dL (ref 30.0–36.0)
MCV: 85.6 fl (ref 78.0–100.0)
Monocytes Absolute: 0.4 K/uL (ref 0.1–1.0)
Monocytes Relative: 10.7 % (ref 3.0–12.0)
Neutro Abs: 1.8 K/uL (ref 1.4–7.7)
Neutrophils Relative %: 53.1 % (ref 43.0–77.0)
Platelets: 267 K/uL (ref 150.0–400.0)
RBC: 4.93 Mil/uL (ref 4.22–5.81)
RDW: 13.1 % (ref 11.5–15.5)
WBC: 3.4 K/uL — ABNORMAL LOW (ref 4.0–10.5)

## 2024-07-20 LAB — BASIC METABOLIC PANEL WITH GFR
BUN: 8 mg/dL (ref 6–23)
CO2: 31 meq/L (ref 19–32)
Calcium: 9.2 mg/dL (ref 8.4–10.5)
Chloride: 104 meq/L (ref 96–112)
Creatinine, Ser: 0.8 mg/dL (ref 0.40–1.50)
GFR: 98.2 mL/min (ref >60.00)
Glucose, Bld: 85 mg/dL (ref 70–99)
Potassium: 4.6 meq/L (ref 3.5–5.1)
Sodium: 143 meq/L (ref 135–145)

## 2024-07-20 LAB — HEPATIC FUNCTION PANEL
ALT: 17 U/L (ref 3–53)
AST: 24 U/L (ref 5–37)
Albumin: 4.7 g/dL (ref 3.5–5.2)
Alkaline Phosphatase: 79 U/L (ref 39–117)
Bilirubin, Direct: 0.1 mg/dL (ref 0.1–0.3)
Total Bilirubin: 0.4 mg/dL (ref 0.2–1.2)
Total Protein: 7.3 g/dL (ref 6.0–8.3)

## 2024-07-20 LAB — C-REACTIVE PROTEIN: CRP: 0.6 mg/dL — ABNORMAL LOW (ref 1.0–20.0)

## 2024-07-20 LAB — TSH: TSH: 1.55 u[IU]/mL (ref 0.35–5.50)

## 2024-07-20 LAB — PSA: PSA: 0.47 ng/mL (ref 0.10–4.00)

## 2024-07-20 MED ORDER — AMLODIPINE BESYLATE 5 MG PO TABS: 5.0000 mg | ORAL_TABLET | Freq: Every day | ORAL | 1 refills | Status: AC

## 2024-07-20 MED ORDER — ROSUVASTATIN CALCIUM 10 MG PO TABS: 10.0000 mg | ORAL_TABLET | Freq: Every day | ORAL | 1 refills | Status: AC

## 2024-07-20 MED ORDER — DOXEPIN HCL 10 MG PO CAPS: 10.0000 mg | ORAL_CAPSULE | Freq: Every day | ORAL | 0 refills | Status: DC

## 2024-07-20 MED ORDER — TRIAMCINOLONE ACETONIDE 0.1 % EX OINT: TOPICAL_OINTMENT | CUTANEOUS | 1 refills | Status: AC

## 2024-07-20 MED ORDER — COVID-19 MRNA VAC-TRIS(PFIZER) 30 MCG/0.3ML IM SUSY: 0.3000 mL | PREFILLED_SYRINGE | Freq: Once | INTRAMUSCULAR | 0 refills | Status: AC

## 2024-07-20 MED ORDER — METHYLPREDNISOLONE 4 MG PO TBPK: ORAL_TABLET | ORAL | 0 refills | Status: AC

## 2024-07-20 NOTE — Patient Instructions (Signed)
 Health Maintenance, Male  Adopting a healthy lifestyle and getting preventive care are important in promoting health and wellness. Ask your health care provider about:  The right schedule for you to have regular tests and exams.  Things you can do on your own to prevent diseases and keep yourself healthy.  What should I know about diet, weight, and exercise?  Eat a healthy diet    Eat a diet that includes plenty of vegetables, fruits, low-fat dairy products, and lean protein.  Do not eat a lot of foods that are high in solid fats, added sugars, or sodium.  Maintain a healthy weight  Body mass index (BMI) is a measurement that can be used to identify possible weight problems. It estimates body fat based on height and weight. Your health care provider can help determine your BMI and help you achieve or maintain a healthy weight.  Get regular exercise  Get regular exercise. This is one of the most important things you can do for your health. Most adults should:  Exercise for at least 150 minutes each week. The exercise should increase your heart rate and make you sweat (moderate-intensity exercise).  Do strengthening exercises at least twice a week. This is in addition to the moderate-intensity exercise.  Spend less time sitting. Even light physical activity can be beneficial.  Watch cholesterol and blood lipids  Have your blood tested for lipids and cholesterol at 57 years of age, then have this test every 5 years.  You may need to have your cholesterol levels checked more often if:  Your lipid or cholesterol levels are high.  You are older than 57 years of age.  You are at high risk for heart disease.  What should I know about cancer screening?  Many types of cancers can be detected early and may often be prevented. Depending on your health history and family history, you may need to have cancer screening at various ages. This may include screening for:  Colorectal cancer.  Prostate cancer.  Skin cancer.  Lung  cancer.  What should I know about heart disease, diabetes, and high blood pressure?  Blood pressure and heart disease  High blood pressure causes heart disease and increases the risk of stroke. This is more likely to develop in people who have high blood pressure readings or are overweight.  Talk with your health care provider about your target blood pressure readings.  Have your blood pressure checked:  Every 3-5 years if you are 24-52 years of age.  Every year if you are 3 years old or older.  If you are between the ages of 60 and 72 and are a current or former smoker, ask your health care provider if you should have a one-time screening for abdominal aortic aneurysm (AAA).  Diabetes  Have regular diabetes screenings. This checks your fasting blood sugar level. Have the screening done:  Once every three years after age 66 if you are at a normal weight and have a low risk for diabetes.  More often and at a younger age if you are overweight or have a high risk for diabetes.  What should I know about preventing infection?  Hepatitis B  If you have a higher risk for hepatitis B, you should be screened for this virus. Talk with your health care provider to find out if you are at risk for hepatitis B infection.  Hepatitis C  Blood testing is recommended for:  Everyone born from [birth year] through 1964.  Anyone  with known risk factors for hepatitis C.  Sexually transmitted infections (STIs)  You should be screened each year for STIs, including gonorrhea and chlamydia, if:  You are sexually active and are younger than 57 years of age.  You are older than 57 years of age and your health care provider tells you that you are at risk for this type of infection.  Your sexual activity has changed since you were last screened, and you are at increased risk for chlamydia or gonorrhea. Ask your health care provider if you are at risk.  Ask your health care provider about whether you are at high risk for HIV. Your health care provider  may recommend a prescription medicine to help prevent HIV infection. If you choose to take medicine to prevent HIV, you should first get tested for HIV. You should then be tested every 3 months for as long as you are taking the medicine.  Follow these instructions at home:  Alcohol use  Do not drink alcohol if your health care provider tells you not to drink.  If you drink alcohol:  Limit how much you have to 0-2 drinks a day.  Know how much alcohol is in your drink. In the [place] one drink equals one 12 oz bottle of beer (355 mL), one 5 oz glass of wine (148 mL), or one 1 oz glass of hard liquor (44 mL).  Lifestyle  Do not use any products that contain nicotine or tobacco. These products include cigarettes, chewing tobacco, and vaping devices, such as e-cigarettes. If you need help quitting, ask your health care provider.  Do not use street drugs.  Do not share needles.  Ask your health care provider for help if you need support or information about quitting drugs.  General instructions  Schedule regular health, dental, and eye exams.  Stay current with your vaccines.  Tell your health care provider if:  You often feel depressed.  You have ever been abused or do not feel safe at home.  Summary  Adopting a healthy lifestyle and getting preventive care are important in promoting health and wellness.  Follow your health care provider's instructions about healthy diet, exercising, and getting tested or screened for diseases.  Follow your health care provider's instructions on monitoring your cholesterol and blood pressure.  This information is not intended to replace advice given to you by your health care provider. Make sure you discuss any questions you have with your health care provider.  Document Revised: 12/10/2020 Document Reviewed: 12/10/2020  Elsevier Patient Education  2024 [employer].

## 2024-07-20 NOTE — Progress Notes (Unsigned)
 Subjective:  Patient ID: Donald Matthews, male    DOB: 28-Apr-1966  Age: 58 y.o. MRN: [id number]  CC: Rash (Bilateral front of leg and back. Patient noticed the rash last week. Itches really bad. ), Annual Exam, and Hypertension   HPI [name] presents for a CPX and f/up ---  Discussed the use of AI scribe software for clinical note transcription with the patient, who gave verbal consent to proceed.  History of Present Illness Donald Matthews is a 58 year old male who presents with a rash on his legs.  Approximately ten days ago, he experienced severe itching, initially managed with Benadryl, which provided temporary relief. However, the rash returned and spread to his legs, becoming flaky and red, especially after showering and when the skin dries. He also used a product called 'super med' for temporary relief, but the rash recurred.  The rash, initially itchy, has now become painful. He denies any known triggers such as new foods or medications, except for aspirin, which he has taken. He has not used other medications like ibuprofen  or Aleve. No headaches, blurred vision, chest pain, or shortness of breath.  He has a history of high blood pressure. He works in editor, commissioning. No significant family history reported.     Outpatient Medications Prior to Visit  Medication Sig Dispense Refill   Cholecalciferol (VITAMIN D3) 2000 units capsule Take 1 capsule (2,000 Units total) by mouth daily. 100 capsule 3   famciclovir  (FAMVIR ) 500 MG tablet Take 1 tablet (500 mg total) by mouth 2 (two) times daily. 180 tablet 3   thiamine (VITAMIN B-1) 100 MG tablet Take 1 tablet (100 mg total) by mouth daily. 100 tablet 3   amLODipine  (NORVASC ) 5 MG tablet Take 1 tablet (5 mg total) by mouth daily. 90 tablet 3   triamcinolone  ointment (KENALOG ) 0.1 % APPLY TO AFFECTED AREA TWICE A DAY. 454 g 3   sildenafil  (VIAGRA ) 100 MG tablet TAKE 1 TABLET BY MOUTH AS  NEEDED FOR ERECTILE DYSFUNCTION. (Patient not taking: Reported on 07/20/2024) 6 tablet 0   tacrolimus  (PROTOPIC ) 0.1 % ointment Apply topically 2 (two) times daily as needed. (Patient not taking: Reported on 07/20/2024) 100 g 1   b complex vitamins tablet Take 1 tablet by mouth daily. 100 tablet 3   gabapentin  (NEURONTIN ) 100 MG capsule TAKE 1 CAPSULE IN THE MORNING AND 3 CAPSULES AT NIGHT FOR EAR SYMPTOMS. 120 capsule 1   No facility-administered medications prior to visit.    ROS Review of Systems  Objective:  BP (!) 158/102 (BP Location: Left Arm, Patient Position: Sitting, Cuff Size: Normal)   Pulse 88   Temp 97.9 F (36.6 C) (Oral)   Resp 16   Ht 5' 9 (1.753 m)   Wt 162 lb 9.6 oz (73.8 kg)   SpO2 96%   BMI 24.01 kg/m   BP Readings from Last 3 Encounters:  07/20/24 (!) 158/102  11/13/22 (!) 154/104  11/12/22 132/86    Wt Readings from Last 3 Encounters:  07/20/24 162 lb 9.6 oz (73.8 kg)  11/13/22 165 lb (74.8 kg)  11/12/22 168 lb (76.2 kg)    Physical Exam Cardiovascular:     Rate and Rhythm: Normal rate and regular rhythm.     Comments: EKG--- NSR, 79 bpm No LVH, Q waves, or ST/T wave changes  Musculoskeletal:     Right lower leg:  No edema.     Left lower leg: No edema.     Lab Results  Component Value Date   WBC 3.4 (L) 07/20/2024   HGB 14.0 07/20/2024   HCT 42.2 07/20/2024   PLT 267.0 07/20/2024   GLUCOSE 85 07/20/2024   CHOL 164 07/20/2024   TRIG 69.0 07/20/2024   HDL 66.40 07/20/2024   LDLCALC 84 07/20/2024   ALT 17 07/20/2024   AST 24 07/20/2024   NA 143 07/20/2024   K 4.6 07/20/2024   CL 104 07/20/2024   CREATININE 0.80 07/20/2024   BUN 8 07/20/2024   CO2 31 07/20/2024   TSH 1.55 07/20/2024   PSA 0.47 07/20/2024   HGBA1C 5.4 07/15/2016    DG Facial Bones 1-2 Views Result Date: 05/06/2018 CLINICAL DATA:  Facial swelling left side EXAM: FACIAL BONES - 1-2 VIEW COMPARISON:  None. FINDINGS: Frontal, Waters, Towne's, and lateral views  obtained. There is no fracture or dislocation. No erosive change or bony destruction. There is opacification in the left maxillary antrum with air-fluid level in this area. Other paranasal sinuses are clear. There is leftward deviation of the nasal septum. Mastoids are clear. IMPRESSION: Left maxillary sinusitis with air-fluid level in the left maxillary antrum. Deviated nasal septum toward the left. No blastic or lytic bone lesions. No fracture or dislocation. No obvious soft tissue swelling by radiography. Electronically Signed   By: Elsie Repine III M.D.   On: 05/06/2018 09:08    The 10-year ASCVD risk score (Arnett DK, et al., 2019) is: 15.1%   Values used to calculate the score:     Age: 61 years     Clinically relevant sex: Male     Is Non-Hispanic African American: Yes     Diabetic: No     Tobacco smoker: No     Systolic Blood Pressure: 158 mmHg     Is BP treated: Yes     HDL Cholesterol: 66.4 mg/dL     Total Cholesterol: 164 mg/dL   Estimated Creatinine Clearance: 101.9 mL/min (by C-G formula based on SCr of 0.8 mg/dL).   Assessment & Plan:  Essential hypertension -     TSH; Future -     Urinalysis, Routine w reflex microscopic; Future -     AMB Referral VBCI Care Management -     Aldosterone + renin activity w/ ratio; Future -     EKG 12-Lead -     Basic metabolic panel with GFR; Future -     CBC with Differential/Platelet; Future -     Hepatic function panel; Future -     amLODIPine  Besylate; Take 1 tablet (5 mg total) by mouth daily.  Dispense: 90 tablet; Refill: 1 -     COVID-19 mRNA Vac-TriS(Pfizer); Inject 0.3 mLs into the muscle once for 1 dose.  Dispense: 0.3 mL; Refill: 0  Encounter for general adult medical examination with abnormal findings -     Lipid panel; Future -     PSA; Future -     Hepatitis C antibody; Future -     HIV Antibody (routine testing w rflx); Future  Need for hepatitis C screening test -     Hepatitis C antibody; Future  Encounter for  screening for HIV -     HIV Antibody (routine testing w rflx); Future  Intrinsic eczema -     Ambulatory referral to Allergy -     Doxepin  HCl; Take 1 capsule (10 mg total) by mouth at bedtime.  Dispense: 30 capsule; Refill: 0 -     methylPREDNISolone ; TAKE AS DIRECTED  Dispense: 21 tablet; Refill: 0 -     Triamcinolone  Acetonide; APPLY TO AFFECTED AREA TWICE A DAY.  Dispense: 454 g; Refill: 1 -     C-reactive protein; Future  Screening for colon cancer -     Ambulatory referral to Gastroenterology  Urticaria -     Doxepin  HCl; Take 1 capsule (10 mg total) by mouth at bedtime.  Dispense: 30 capsule; Refill: 0 -     methylPREDNISolone ; TAKE AS DIRECTED  Dispense: 21 tablet; Refill: 0 -     C-reactive protein; Future  Immunization due -     Pneumococcal conjugate vaccine 20-valent  Need for immunization against influenza -     Flu vaccine trivalent PF, 6mos and older(Flulaval,Afluria,Fluarix,Fluzone)  Dyslipidemia, goal LDL below 70 -     Rosuvastatin  Calcium ; Take 1 tablet (10 mg total) by mouth daily.  Dispense: 90 tablet; Refill: 1     Follow-up: Return in about 3 months (around 10/18/2024).  Debby Molt, MD

## 2024-07-26 LAB — ALDOSTERONE + RENIN ACTIVITY W/ RATIO
ALDO / PRA Ratio: 20.6 ratio (ref 0.9–28.9)
Aldosterone: 7 ng/dL
Renin Activity: 0.34 ng/mL/h (ref 0.25–5.82)

## 2024-07-26 LAB — HIV ANTIBODY (ROUTINE TESTING W REFLEX)
HIV 1&2 Ab, 4th Generation: NONREACTIVE
HIV FINAL INTERPRETATION: NEGATIVE

## 2024-07-26 LAB — HEPATITIS C ANTIBODY: Hepatitis C Ab: NONREACTIVE

## 2024-07-26 MED ORDER — INDAPAMIDE 1.25 MG PO TABS: 1.2500 mg | ORAL_TABLET | Freq: Every day | ORAL | 0 refills | Status: AC

## 2024-07-29 ENCOUNTER — Ambulatory Visit: Payer: Self-pay | Admitting: Internal Medicine

## 2024-08-01 ENCOUNTER — Telehealth: Payer: Self-pay | Admitting: *Deleted

## 2024-08-01 NOTE — Progress Notes (Unsigned)
 Care Guide Pharmacy Note  08/01/2024 Name: Donald Matthews MRN: [id number] DOB: [birth year]  Referred By: Garald Karlynn GAILS, MD Reason for referral: Call Attempt #1 and Complex Care Management (Outreach to schedule referral with pharmacist )   Donald Matthews is a 58 y.o. year old male who is a primary care patient of Plotnikov, Karlynn GAILS, MD.  Donald Matthews was referred to the pharmacist for assistance related to: HTN  An unsuccessful telephone outreach was attempted today to contact the patient who was referred to the pharmacy team for assistance with medication management. Additional attempts will be made to contact the patient.  Donald Matthews, CMA [place]  [facility], [facility] Guide Direct Dial: [phone]  Fax: [phone] Website: [email or web address]

## 2024-08-02 NOTE — Progress Notes (Unsigned)
 Care Guide Pharmacy Note  08/02/2024 Name: Donald Matthews MRN: [id number] DOB: 12/20/1965  Referred By: Garald Karlynn GAILS, MD Reason for referral: Call Attempt #1 and Complex Care Management (Outreach to schedule referral with pharmacist )   Donald Matthews is a 58 y.o. year old male who is a primary care patient of Plotnikov, Karlynn GAILS, MD.  Donald Matthews was referred to the pharmacist for assistance related to: HTN  A second unsuccessful telephone outreach was attempted today to contact the patient who was referred to the pharmacy team for assistance with medication management. Additional attempts will be made to contact the patient.  Donald Matthews, CMA [place]  [facility], [facility] Guide Direct Dial: [phone]  Fax: [phone] Website: [email or web address]

## 2024-08-03 NOTE — Progress Notes (Signed)
 Care Guide Pharmacy Note  08/03/2024 Name: FARIS COOLMAN MRN: [id number] DOB: [birth year]  Referred By: Garald Karlynn GAILS, MD Reason for referral: Call Attempt #1 and Complex Care Management (Outreach to schedule referral with pharmacist )   Kutler H Conchas is a 58 y.o. year old male who is a primary care patient of Plotnikov, Karlynn GAILS, MD.  Cabot H Matherly was referred to the pharmacist for assistance related to: HTN  Successful contact was made with the patient to discuss pharmacy services including being ready for the pharmacist to call at least 5 minutes before the scheduled appointment time and to have medication bottles and any blood pressure readings ready for review. The patient agreed to meet with the pharmacist via telephone visit on 08/08/2024  Thedford Franks, CMA [place]  [facility], [facility] Guide Direct Dial: [phone]  Fax: [phone] Website: [email or web address]

## 2024-08-03 NOTE — Progress Notes (Signed)
 Care Guide Pharmacy Note  08/03/2024 Name: Donald Matthews MRN: [id number] DOB: [birth year]  Referred By: Garald Karlynn GAILS, MD Reason for referral: Call Attempt #1 and Complex Care Management (Outreach to schedule referral with pharmacist )   Donald Matthews is a 58 y.o. year old male who is a primary care patient of Plotnikov, Karlynn GAILS, MD.  Donald Matthews was referred to the pharmacist for assistance related to: HTN  A third unsuccessful telephone outreach was attempted today to contact the patient who was referred to the pharmacy team for assistance with medication management. The Population Health team is pleased to engage with this patient at any time in the future upon receipt of referral and should he/she be interested in assistance from the Population Health team.  Donald Matthews, CMA [facility]  [facility], [facility] Guide Direct Dial: [phone]  Fax: [phone] Website: [email or web address]

## 2024-08-08 ENCOUNTER — Other Ambulatory Visit: Admitting: Pharmacist

## 2024-08-08 DIAGNOSIS — E785 Hyperlipidemia, unspecified: Secondary | ICD-10-CM

## 2024-08-08 DIAGNOSIS — L2084 Intrinsic (allergic) eczema: Secondary | ICD-10-CM

## 2024-08-08 DIAGNOSIS — L509 Urticaria, unspecified: Secondary | ICD-10-CM

## 2024-08-08 DIAGNOSIS — I10 Essential (primary) hypertension: Secondary | ICD-10-CM

## 2024-08-08 MED ORDER — DOXEPIN HCL 10 MG PO CAPS: 10.0000 mg | ORAL_CAPSULE | Freq: Every day | ORAL | 0 refills | Status: AC

## 2024-08-08 NOTE — Patient Instructions (Addendum)
 It was a pleasure speaking with you today!  -Monitor blood pressure and keep a log to discuss at next telephone visit -Continue amlodipine  5mg  daily and rosuvastatin  10mg  once daily  -Telephone visit on 08/12/24  Feel free to call with any questions or concerns!  Dionicia Canavan, PharmD, RPh PGY1 Acute Care Pharmacy Resident [facility]   Darrelyn Drum, PharmD, BCPS, CPP Clinical Pharmacist Practitioner [facility] [phone]

## 2024-08-08 NOTE — Progress Notes (Cosign Needed Addendum)
 "  08/08/2024 Name: Donald Matthews MRN: [id number] DOB: 1965-12-09  Chief Complaint  Patient presents with   Hypertension   Medication Management    Donald Matthews is a 59 y.o. year old male who presented for a telephone visit.   They were referred to the pharmacist by their PCP for assistance in managing hypertension.    Subjective:  Care Team: Primary Care Provider: Garald Karlynn GAILS, MD ; Next Scheduled Visit:    Medication Access/Adherence  Current Pharmacy:  [facility] [place], [place] - [address] [place] [address] Phone: [phone] Fax: [phone]   Patient reports affordability concerns with their medications: No  Patient reports access/transportation concerns to their pharmacy: No  Patient reports adherence concerns with their medications:  No     Hypertension:  Current medications: amlodipine  5mg  daily  Patient has not yet picked up indapamide  from pharmacy, prescribed 12/17  Medications previously tried: HCTZ  Patient reports previously experiencing dizziness and headaches prior to restarting amlodipine  which have now resolved since restarting amlodipine .   Patient does not have a validated, automated, upper arm home BP cuff; has access to BP cuff at work.   Patient denies hypotensive s/sx including dizziness, lightheadedness.  Patient denies hypertensive symptoms including headache, chest pain, shortness of breath.   Hyperlipidemia/ASCVD Risk Reduction: - Current medications: rosuvastatin  10mg  once daily (new start after 12/17 appt)  The 10-year ASCVD risk score (Arnett DK, et al., 2019) is: 15.1%   Values used to calculate the score:     Age: 37 years     Clinically relevant sex: Male     Is Non-Hispanic African American: Yes     Diabetic: No     Tobacco smoker: No     Systolic Blood Pressure: 158 mmHg     Is BP treated: Yes     HDL Cholesterol: 66.4 mg/dL     Total  Cholesterol: 164 mg/dL   Objective:  Lab Results  Component Value Date   HGBA1C 5.4 07/15/2016    Lab Results  Component Value Date   CREATININE 0.80 07/20/2024   BUN 8 07/20/2024   NA 143 07/20/2024   K 4.6 07/20/2024   CL 104 07/20/2024   CO2 31 07/20/2024    Lab Results  Component Value Date   CHOL 164 07/20/2024   HDL 66.40 07/20/2024   LDLCALC 84 07/20/2024   TRIG 69.0 07/20/2024   CHOLHDL 2 07/20/2024    Medications Reviewed Today     Reviewed by Maybelle Dionicia CROME, RPH (Pharmacist) on 08/08/24 at 1445  Med List Status: <None>   Medication Order Taking? Sig Documenting Provider Last Dose Status Informant  amLODipine  (NORVASC ) 5 MG tablet [id number] Yes Take 1 tablet (5 mg total) by mouth daily. Joshua Debby CROME, MD  Active   Cholecalciferol (VITAMIN D3) 2000 units capsule [id number]  Take 1 capsule (2,000 Units total) by mouth daily.  Patient not taking: Reported on 08/08/2024   Plotnikov, Aleksei V, MD  Active   doxepin  (SINEQUAN ) 10 MG capsule [id number] Yes Take 1 capsule (10 mg total) by mouth at bedtime. Joshua Debby CROME, MD  Active   famciclovir  (FAMVIR ) 500 MG tablet [id number]  Take 1 tablet (500 mg total) by mouth 2 (two) times daily.  Patient not taking: Reported on 08/08/2024   Plotnikov, Karlynn GAILS, MD  Active   indapamide  (LOZOL ) 1.25 MG tablet [id number]  Take 1 tablet (1.25 mg total) by  mouth daily.  Patient not taking: Reported on 08/08/2024   Joshua Debby CROME, MD  Active   rosuvastatin  (CRESTOR ) 10 MG tablet [id number] Yes Take 1 tablet (10 mg total) by mouth daily. Joshua Debby CROME, MD  Active   sildenafil  (VIAGRA ) 100 MG tablet [id number]  TAKE 1 TABLET BY MOUTH AS NEEDED FOR ERECTILE DYSFUNCTION.  Patient not taking: Reported on 07/20/2024   Plotnikov, Karlynn GAILS, MD  Active   tacrolimus  (PROTOPIC ) 0.1 % ointment [id number]  Apply topically 2 (two) times daily as needed.  Patient not taking: Reported on 07/20/2024   Plotnikov, Aleksei V, MD  Active   thiamine  (VITAMIN B-1) 100 MG tablet [id number]  Take 1 tablet (100 mg total) by mouth daily.  Patient not taking: Reported on 08/08/2024   Plotnikov, Aleksei V, MD  Active   triamcinolone  ointment (KENALOG ) 0.1 % [id number] Yes APPLY TO AFFECTED AREA TWICE A DAY. Joshua Debby CROME, MD  Active              Assessment/Plan:   Hypertension: - Goal BP <130/80 - Currently uncontrolled - Recommend to monitor blood pressure (patient has BP monitor at work) and keep a log to discuss at next visit - Will determine if pt needs to add indapamide  after reviewing home BP readings   Hyperlipidemia/ASCVD Risk Reduction: - Currently controlled for goal <100  - Continue Rosuvastatin  10mg  daily   Other: -Patient requests refill for doxepin  for urticaria  Follow Up Plan: 1/9 1430 for BP readings  Dionicia Canavan, PharmD, RPh PGY1 Acute Care Pharmacy Resident [facility]  Darrelyn Drum, PharmD, BCPS, CPP Clinical Pharmacist Practitioner [place] [facility] [phone]   Medical screening examination/treatment/procedure(s) were performed by non-physician practitioner and as supervising physician I was immediately available for consultation/collaboration.  I agree with above. Karlynn Noel, MD  "

## 2024-08-12 ENCOUNTER — Other Ambulatory Visit: Admitting: Pharmacist

## 2024-08-12 DIAGNOSIS — I10 Essential (primary) hypertension: Secondary | ICD-10-CM

## 2024-08-12 NOTE — Progress Notes (Cosign Needed)
 "  08/12/2024 Name: Donald Matthews MRN: [id number] DOB: [birth year]  Chief Complaint  Patient presents with   Hypertension   Medication Management    Bassam H Newsham is a 59 y.o. year old male who presented for a telephone visit.   They were referred to the pharmacist by their PCP for assistance in managing hypertension.    Subjective:  Care Team: Primary Care Provider: Garald Karlynn GAILS, MD ; Next Scheduled Visit:    Medication Access/Adherence  Current Pharmacy:  [facility] [place], [place] - [address] [facility] [address] [place] [address] Phone: [phone] Fax: [phone]   Patient reports affordability concerns with their medications: No  Patient reports access/transportation concerns to their pharmacy: No  Patient reports adherence concerns with their medications:  No     Hypertension:  Current medications: amlodipine  5mg  daily  Patient has not yet picked up indapamide  from pharmacy, prescribed 12/17  Medications previously tried: HCTZ  Patient reports previously experiencing dizziness and headaches prior to restarting amlodipine  which have now resolved since restarting amlodipine .   Patient does not have a validated, automated, upper arm home BP cuff; has access to BP cuff at work. 155/103  Patient denies hypotensive s/sx including dizziness, lightheadedness.  Patient denies hypertensive symptoms including headache, chest pain, shortness of breath.   Hyperlipidemia/ASCVD Risk Reduction: - Current medications: rosuvastatin  10mg  once daily (new start after 12/17 appt)  The 10-year ASCVD risk score (Arnett DK, et al., 2019) is: 15.1%   Values used to calculate the score:     Age: 48 years     Clinically relevant sex: Male     Is Non-Hispanic African American: Yes     Diabetic: No     Tobacco smoker: No     Systolic Blood Pressure: 158 mmHg     Is BP treated: Yes     HDL Cholesterol: 66.4 mg/dL      Total Cholesterol: 164 mg/dL   Objective:  Lab Results  Component Value Date   HGBA1C 5.4 07/15/2016    Lab Results  Component Value Date   CREATININE 0.80 07/20/2024   BUN 8 07/20/2024   NA 143 07/20/2024   K 4.6 07/20/2024   CL 104 07/20/2024   CO2 31 07/20/2024    Lab Results  Component Value Date   CHOL 164 07/20/2024   HDL 66.40 07/20/2024   LDLCALC 84 07/20/2024   TRIG 69.0 07/20/2024   CHOLHDL 2 07/20/2024    Medications Reviewed Today     Reviewed by Merceda Lela SAUNDERS, RPH-CPP (Pharmacist) on 08/12/24 at 1537  Med List Status: <None>   Medication Order Taking? Sig Documenting Provider Last Dose Status Informant  amLODipine  (NORVASC ) 5 MG tablet [id number] Yes Take 1 tablet (5 mg total) by mouth daily. Joshua Debby CROME, MD  Active   Cholecalciferol (VITAMIN D3) 2000 units capsule [id number]  Take 1 capsule (2,000 Units total) by mouth daily.  Patient not taking: Reported on 08/08/2024   Plotnikov, Karlynn GAILS, MD  Active   doxepin  (SINEQUAN ) 10 MG capsule [id number] Yes Take 1 capsule (10 mg total) by mouth at bedtime. Joshua Debby CROME, MD  Active   famciclovir  (FAMVIR ) 500 MG tablet [id number]  Take 1 tablet (500 mg total) by mouth 2 (two) times daily.  Patient not taking: Reported on 08/08/2024   Plotnikov, Aleksei V, MD  Active   indapamide  (LOZOL ) 1.25 MG tablet [id number]  Take 1 tablet (1.25 mg total) by  mouth daily.  Patient not taking: Reported on 08/12/2024   Joshua Debby CROME, MD  Active   rosuvastatin  (CRESTOR ) 10 MG tablet [id number] Yes Take 1 tablet (10 mg total) by mouth daily. Joshua Debby CROME, MD  Active   sildenafil  (VIAGRA ) 100 MG tablet [id number]  TAKE 1 TABLET BY MOUTH AS NEEDED FOR ERECTILE DYSFUNCTION.  Patient not taking: Reported on 07/20/2024   Plotnikov, Karlynn GAILS, MD  Active   tacrolimus  (PROTOPIC ) 0.1 % ointment [id number]  Apply topically 2 (two) times daily as needed.  Patient not taking: Reported on 07/20/2024   Plotnikov, Aleksei V, MD  Active    thiamine (VITAMIN B-1) 100 MG tablet [id number]  Take 1 tablet (100 mg total) by mouth daily.  Patient not taking: Reported on 08/08/2024   Plotnikov, Aleksei V, MD  Active   triamcinolone  ointment (KENALOG ) 0.1 % [id number]  APPLY TO AFFECTED AREA TWICE A DAY. Joshua Debby CROME, MD  Active              Assessment/Plan:   Hypertension: - Goal BP <130/80 - Currently uncontrolled - Recommend to start indapamide  1.25 mg daily - pharmacy has it ready for pick up - Recommend to monitor blood pressure (patient has BP monitor at work) and keep a log to discuss at next visit   Hyperlipidemia/ASCVD Risk Reduction: - Currently controlled for goal <100  - Continue Rosuvastatin  10mg  daily     Follow Up Plan: 2 weeks   Darrelyn Drum, PharmD, BCPS, CPP Clinical Pharmacist Practitioner [facility] [phone]  Medical screening examination/treatment/procedure(s) were performed by non-physician practitioner and as supervising physician I was immediately available for consultation/collaboration.  I agree with above. Karlynn Noel, MD  "

## 2024-08-12 NOTE — Patient Instructions (Signed)
 It was a pleasure speaking with you today!  Start taking indapamide  1.25 mg daily with your other medications. Monitor blood pressure. Goal is less than 130/80.  Feel free to call with any questions or concerns!  Darrelyn Drum, PharmD, BCPS, CPP Clinical Pharmacist Practitioner [facility] [phone]

## 2024-08-30 ENCOUNTER — Telehealth: Payer: Self-pay

## 2024-08-30 NOTE — Progress Notes (Signed)
 Unsuccessful attempt to reach patient for telephone encounter. Left voicemail. Will try again on Thursday.

## 2024-08-31 ENCOUNTER — Ambulatory Visit: Payer: Self-pay | Admitting: Allergy

## 2024-08-31 ENCOUNTER — Telehealth: Payer: Self-pay

## 2024-08-31 NOTE — Telephone Encounter (Signed)
 Copied from CRM [id number]. Topic: General - Call Back - No Documentation >> Aug 31, 2024  8:14 AM Ahlexyia S wrote: Reason for CRM: Pt returning call from pharmacist Vanna. I was unable to locate a phone number to reach out to Vanna. I let pt know that per his chart, Vanna stated she will attempt to reach pt again on Thursday. Pt has been advised.

## 2024-09-07 ENCOUNTER — Encounter: Payer: Self-pay | Admitting: Gastroenterology
